# Patient Record
Sex: Male | Born: 1970 | Race: White | Hispanic: No | Marital: Married | Smoking: Never smoker
Health system: Southern US, Community
[De-identification: ages and names within clinical notes are randomized; demographics above are authoritative.]

## PROBLEM LIST (undated history)

## (undated) DIAGNOSIS — M1990 Unspecified osteoarthritis, unspecified site: Secondary | ICD-10-CM

## (undated) DIAGNOSIS — I10 Essential (primary) hypertension: Secondary | ICD-10-CM

## (undated) DIAGNOSIS — Z86018 Personal history of other benign neoplasm: Secondary | ICD-10-CM

## (undated) DIAGNOSIS — Z8719 Personal history of other diseases of the digestive system: Secondary | ICD-10-CM

## (undated) DIAGNOSIS — T7840XA Allergy, unspecified, initial encounter: Secondary | ICD-10-CM

## (undated) DIAGNOSIS — Z9889 Other specified postprocedural states: Secondary | ICD-10-CM

## (undated) DIAGNOSIS — E785 Hyperlipidemia, unspecified: Secondary | ICD-10-CM

## (undated) HISTORY — DX: Other specified postprocedural states: Z98.890

## (undated) HISTORY — DX: Personal history of other diseases of the digestive system: Z87.19

## (undated) HISTORY — PX: LIVER BIOPSY: SHX301

## (undated) HISTORY — PX: DERMOID CYST  EXCISION: SHX1452

## (undated) HISTORY — DX: Other specified postprocedural states: Z86.018

## (undated) HISTORY — DX: Allergy, unspecified, initial encounter: T78.40XA

## (undated) HISTORY — DX: Unspecified osteoarthritis, unspecified site: M19.90

## (undated) HISTORY — DX: Personal history of other benign neoplasm: Z86.018

## (undated) HISTORY — DX: Hyperlipidemia, unspecified: E78.5

---

## 1997-09-07 HISTORY — PX: RADICAL ORCHIECTOMY: SHX2285

## 2003-09-08 HISTORY — PX: SHOULDER ARTHROSCOPY: SHX128

## 2016-11-19 DIAGNOSIS — J1089 Influenza due to other identified influenza virus with other manifestations: Secondary | ICD-10-CM | POA: Diagnosis not present

## 2017-05-13 ENCOUNTER — Emergency Department: Payer: BLUE CROSS/BLUE SHIELD

## 2017-05-13 ENCOUNTER — Encounter: Payer: Self-pay | Admitting: Emergency Medicine

## 2017-05-13 ENCOUNTER — Emergency Department
Admission: EM | Admit: 2017-05-13 | Discharge: 2017-05-13 | Disposition: A | Discharge status: Home / Self Care | Payer: BLUE CROSS/BLUE SHIELD | Attending: Emergency Medicine | Admitting: Emergency Medicine

## 2017-05-13 DIAGNOSIS — R079 Chest pain, unspecified: Secondary | ICD-10-CM | POA: Diagnosis not present

## 2017-05-13 DIAGNOSIS — I10 Essential (primary) hypertension: Secondary | ICD-10-CM | POA: Diagnosis not present

## 2017-05-13 DIAGNOSIS — R0789 Other chest pain: Secondary | ICD-10-CM | POA: Diagnosis not present

## 2017-05-13 HISTORY — DX: Essential (primary) hypertension: I10

## 2017-05-13 LAB — BASIC METABOLIC PANEL
Anion gap: 8 (ref 5–15)
BUN: 15 mg/dL (ref 6–20)
CALCIUM: 9.6 mg/dL (ref 8.9–10.3)
CHLORIDE: 103 mmol/L (ref 101–111)
CO2: 27 mmol/L (ref 22–32)
CREATININE: 1.23 mg/dL (ref 0.61–1.24)
GFR calc Af Amer: >60 mL/min (ref >60)
GFR calc non Af Amer: >60 mL/min (ref >60)
GLUCOSE: 123 mg/dL — AB (ref 65–99)
Potassium: 3.9 mmol/L (ref 3.5–5.1)
Sodium: 138 mmol/L (ref 135–145)

## 2017-05-13 LAB — CBC
HCT: 45.5 % (ref 40.0–52.0)
Hemoglobin: 15.6 g/dL (ref 13.0–18.0)
MCH: 29.1 pg (ref 26.0–34.0)
MCHC: 34.2 g/dL (ref 32.0–36.0)
MCV: 85 fL (ref 80.0–100.0)
PLATELETS: 287 10*3/uL (ref 150–440)
RBC: 5.35 MIL/uL (ref 4.40–5.90)
RDW: 13.4 % (ref 11.5–14.5)
WBC: 10.1 10*3/uL (ref 3.8–10.6)

## 2017-05-13 LAB — TROPONIN I: Troponin I: <0.03 ng/mL (ref <0.03)

## 2017-05-13 MED ORDER — CYCLOBENZAPRINE HCL 10 MG PO TABS
10.0000 mg | ORAL_TABLET | Freq: Once | ORAL | Status: AC
  Administered 2017-05-13: 10 mg via ORAL

## 2017-05-13 MED ORDER — IBUPROFEN 600 MG PO TABS: 600.0000 mg | ORAL_TABLET | Freq: Four times a day (QID) | ORAL | 0 refills | Status: DC | PRN

## 2017-05-13 MED ORDER — CYCLOBENZAPRINE HCL 10 MG PO TABS
ORAL_TABLET | ORAL | Status: AC
  Filled 2017-05-13: qty 1

## 2017-05-13 MED ORDER — CYCLOBENZAPRINE HCL 5 MG PO TABS: 5.0000 mg | ORAL_TABLET | Freq: Three times a day (TID) | ORAL | 0 refills | Status: DC | PRN

## 2017-05-13 NOTE — ED Provider Notes (Signed)
[facility] Emergency Department Provider Note  Time seen: 9:13 PM  I have reviewed the triage vital signs and the nursing notes.   HISTORY  Chief Complaint Chest Pain    HPI Todd Black is a 46 y.o. male With a past medical history of hypertension who presents to the emergency department for left-sided chest discomfort. According to the patient since yesterday he has been experiencing left-sided chest pain. He states the pain is worse if he takes a deep breath or moves his left arm. Patient thought he pulled a muscle but did not recall doing anything overly strenuous. States continued discomfort today so he came to the emergency department for evaluation. Patient states he was evaluated last year for chest pain as well ultimately had a normal workup including a stress test per patient. States the pain is moderate, dull but becomes sharp with movement. Denies any cough. Denies any fever. No leg pain or swelling.  Past Medical History:  Diagnosis Date  . Hypertension     There are no active problems to display for this patient.   Past Surgical History:  Procedure Laterality Date  . LIVER BIOPSY      Prior to Admission medications   Not on File    Allergies  Allergen Reactions  . Bee Venom     No family history on file.  Social History Social History  Substance Use Topics  . Smoking status: Never Smoker  . Smokeless tobacco: Never Used  . Alcohol use No    Review of Systems Constitutional: Negative for fever. Cardiovascular: intermittent left chest pain Respiratory: Negative for shortness of breath. Gastrointestinal: Negative for abdominal pain Musculoskeletal: negative for leg pain or swelling. Neurological: Negative for headaches, focal weakness or numbness. All other ROS negative  ____________________________________________   PHYSICAL EXAM:  VITAL SIGNS: ED Triage Vitals  Enc Vitals Group     BP 05/13/17 2022 (!) 179/99      Pulse Rate 05/13/17 2022 75     Resp 05/13/17 2022 18     Temp 05/13/17 2022 98.7 F (37.1 C)     Temp src --      SpO2 05/13/17 2022 97 %     Weight 05/13/17 2023 198 lb (89.8 kg)     Height 05/13/17 2023 5\' 7"  (1.702 m)     Head Circumference --      Peak Flow --      Pain Score 05/13/17 2023 7     Pain Loc --      Pain Edu? --      Excl. in [place]? --     Constitutional: Alert and oriented. Well appearing and in no distress. Eyes: Normal exam ENT   Head: Normocephalic and atraumatic.   Mouth/Throat: Mucous membranes are moist. Cardiovascular: Normal rate, regular rhythm. No murmur Respiratory: Normal respiratory effort without tachypnea nor retractions. Breath sounds are clear. Moderate left chest tenderness palpation, worse with movement of the left shoulder. Good range of motion in left shoulder. Gastrointestinal: Soft and nontender. No distention. Musculoskeletal: Nontender with normal range of motion in all extremities. No lower extremity tenderness or edema. Neurologic:  Normal speech and language. No gross focal neurologic deficit Skin:  Skin is warm, dry and intact.  Psychiatric: Mood and affect are normal. Speech and behavior are normal.   ____________________________________________    EKG  EKG reviewed and interpreted by myself shows normal sinus rhythm at 74 bpm, narrow QRS, normal axis, normal intervals, no concerning ST changes.  ____________________________________________    RADIOLOGY  x-ray negative  ____________________________________________   INITIAL IMPRESSION / ASSESSMENT AND PLAN / ED COURSE  Pertinent labs & imaging results that were available during my care of the patient were reviewed by me and considered in my medical decision making (see chart for details).  patient presented to the emergency department for left-sided chest pain for the past 2 days, worse with movement of the left arm/shoulder. Overall the patient appears very  well, chest pain is reproducible on exam. Patient's labs are normal including negative troponin. EKG is reassuring. Highly suspect chest wall pain/musculoskeletal pain. Patient will follow-up with his doctor.I discussed my normal chest pain return precautions.   x-rays negative. Labs are normal. Highly suspect musculoskeletal pain. We will discharge with Fioricet and ibuprofen. I discussed my normal chest pain return precautions. ____________________________________________   FINAL CLINICAL IMPRESSION(S) / ED DIAGNOSES  chest pain    Harvest Dark, MD 05/13/17 2152

## 2017-05-13 NOTE — ED Triage Notes (Signed)
Patient with complaint of left side chest pain radiating to his back and left arm. Patient states that the chest pain started yesterday. Patient states that the pain is worse when he takes a deep breath. Patient denies shortness of breath or nausea.

## 2017-05-13 NOTE — ED Notes (Signed)
Pt has left side chest pain since yesterday.  Pt reports pain in left shoulder blade.  No sob.  No n/v/d   No diaphoresis.  Pt alert.  Speech clear.

## 2017-05-13 NOTE — Discharge Instructions (Signed)
You have been seen in the emergency department today for chest pain. Your workup has shown normal results. As we discussed please follow-up with your primary care physician in the next 1-2 days for recheck. Return to the emergency department for any further chest pain, trouble breathing, or any other symptom personally concerning to yourself. °

## 2017-09-25 DIAGNOSIS — I10 Essential (primary) hypertension: Secondary | ICD-10-CM | POA: Diagnosis not present

## 2018-08-10 ENCOUNTER — Telehealth: Payer: Self-pay

## 2018-08-10 NOTE — Telephone Encounter (Signed)
Called pt to r/s 12/5 appt. No answer and the mailbox is full.

## 2018-08-10 NOTE — Telephone Encounter (Signed)
Disregard previous message. Pt doesn't need to r/s 12/5 appt.

## 2018-08-11 ENCOUNTER — Ambulatory Visit (INDEPENDENT_AMBULATORY_CARE_PROVIDER_SITE_OTHER): Payer: BLUE CROSS/BLUE SHIELD | Admitting: Family Medicine

## 2018-08-11 ENCOUNTER — Encounter: Payer: Self-pay | Admitting: Family Medicine

## 2018-08-11 VITALS — BP 182/110 | HR 58 | Temp 98.3°F | Ht 67.0 in | Wt 192.2 lb

## 2018-08-11 DIAGNOSIS — Z23 Encounter for immunization: Secondary | ICD-10-CM

## 2018-08-11 DIAGNOSIS — K760 Fatty (change of) liver, not elsewhere classified: Secondary | ICD-10-CM | POA: Insufficient documentation

## 2018-08-11 DIAGNOSIS — E785 Hyperlipidemia, unspecified: Secondary | ICD-10-CM | POA: Diagnosis not present

## 2018-08-11 DIAGNOSIS — I10 Essential (primary) hypertension: Secondary | ICD-10-CM | POA: Diagnosis not present

## 2018-08-11 MED ORDER — HYDROCHLOROTHIAZIDE 25 MG PO TABS: 25.0000 mg | ORAL_TABLET | Freq: Every day | ORAL | 2 refills | Status: DC

## 2018-08-11 NOTE — Patient Instructions (Addendum)
Check your blood pressure a few times a week.   Make sure you are sitting down with feet flat on the ground and you have been sitting for at least 5 minutes.   Goal blood pressure ~130/80  If you develop chest pain, shortness of breath, headaches, vision changes -- these are signs that your blood pressure is too high and you should seek medical attention immediately  Return in 4 weeks for blood work  Let me know if you blood pressure is consistently 170-180/100  Consider trying taking your medication at night

## 2018-08-11 NOTE — Assessment & Plan Note (Signed)
Per report previously on statin but stopped. Will follow-up lipids and assess risk.

## 2018-08-11 NOTE — Assessment & Plan Note (Signed)
BP elevated today w/o symptoms of HTN emergency. Will try to lower slowly with additional antihypertensive agent. Long discussion regarding possibility of stopping Bystolic and trying a Ca channel blocker if still having difficulty with BP control HR 58 today but given hx of poor tolerance of 3rd agent suspect this may be in part related to Beta blocker. Home monitoring and return sooner if BP remaining 180/100.

## 2018-08-11 NOTE — Addendum Note (Signed)
Addended by: Kris Mouton on: 08/11/2018 09:32 AM   Modules accepted: Orders

## 2018-08-11 NOTE — Progress Notes (Signed)
Subjective:     Todd Black is a 47 y.o. male presenting for Establish Care (previous PCP was with [facility] in [place].)     HPI  #HTN - checks at work 1 time per week - 130/80 - was high a few years ago 200/110 - high stress at work at that time  - has lost 15 lbs in the last year - through exercise - no cp, sob, HA - was on medication 10 years ago, though no longer needed - no medication for 4-5 years - has tried lisinopril and nebivolol and was eventually on a 3rd medication - but could not tolerate a 3rd drug - takes his medication daily - though recent GI upset and missed doses on monday  Review of Systems  Eyes: Negative for visual disturbance.  Respiratory: Negative for chest tightness and shortness of breath.   Cardiovascular: Negative for chest pain.  Neurological: Negative for headaches.     Social History   Tobacco Use  Smoking Status Never Smoker  Smokeless Tobacco Never Used        Objective:    BP Readings from Last 3 Encounters:  08/11/18 (!) 182/110  05/13/17 (!) 158/90   Wt Readings from Last 3 Encounters:  08/11/18 192 lb 4 oz (87.2 kg)  05/13/17 198 lb (89.8 kg)    BP (!) 182/110   Pulse (!) 58   Temp 98.3 F (36.8 C)   Ht 5\' 7"  (1.702 m)   Wt 192 lb 4 oz (87.2 kg)   SpO2 99%   BMI 30.11 kg/m    Physical Exam  Constitutional: He appears well-developed and well-nourished. No distress.  HENT:  Head: Normocephalic and atraumatic.  Right Ear: External ear normal.  Left Ear: External ear normal.  Eyes: Conjunctivae and EOM are normal. No scleral icterus.  Neck: Neck supple. Carotid bruit is not present.  Cardiovascular: Normal rate, regular rhythm and normal heart sounds.  No murmur heard. Pulmonary/Chest: Effort normal and breath sounds normal. No respiratory distress. He has no wheezes.  Abdominal: Soft. Bowel sounds are normal. He exhibits no distension. There is no tenderness. There is no guarding.  Upper  abdominal hernia  Musculoskeletal: Normal range of motion.  Neurological: He is alert.  Skin: Skin is warm and dry. Capillary refill takes less than 2 seconds. He is not diaphoretic.  Psychiatric: He has a normal mood and affect.          Assessment & Plan:   Problem List Items Addressed This Visit      Cardiovascular and Mediastinum   HTN (hypertension) - Primary    BP elevated today w/o symptoms of HTN emergency. Will try to lower slowly with additional antihypertensive agent. Long discussion regarding possibility of stopping Bystolic and trying a Ca channel blocker if still having difficulty with BP control HR 58 today but given hx of poor tolerance of 3rd agent suspect this may be in part related to Beta blocker. Home monitoring and return sooner if BP remaining 180/100.       Relevant Medications   lisinopril (PRINIVIL,ZESTRIL) 40 MG tablet   nebivolol (BYSTOLIC) 10 MG tablet   hydrochlorothiazide (HYDRODIURIL) 25 MG tablet     Other   Hyperlipidemia    Per report previously on statin but stopped. Will follow-up lipids and assess risk.       Relevant Medications   lisinopril (PRINIVIL,ZESTRIL) 40 MG tablet   nebivolol (BYSTOLIC) 10 MG tablet   hydrochlorothiazide (HYDRODIURIL) 25 MG  tablet        Return in about 4 weeks (around 09/08/2018).  Lesleigh Noe, MD

## 2018-08-15 ENCOUNTER — Encounter: Payer: Self-pay | Admitting: Family Medicine

## 2018-08-15 DIAGNOSIS — I517 Cardiomegaly: Secondary | ICD-10-CM | POA: Insufficient documentation

## 2018-08-16 ENCOUNTER — Ambulatory Visit (INDEPENDENT_AMBULATORY_CARE_PROVIDER_SITE_OTHER): Payer: BLUE CROSS/BLUE SHIELD | Admitting: Family Medicine

## 2018-08-16 ENCOUNTER — Telehealth: Payer: Self-pay | Admitting: Family Medicine

## 2018-08-16 ENCOUNTER — Encounter: Payer: Self-pay | Admitting: Family Medicine

## 2018-08-16 ENCOUNTER — Telehealth: Payer: Self-pay

## 2018-08-16 VITALS — BP 98/76 | HR 80 | Temp 98.2°F | Ht 67.0 in | Wt 188.5 lb

## 2018-08-16 DIAGNOSIS — I10 Essential (primary) hypertension: Secondary | ICD-10-CM | POA: Diagnosis not present

## 2018-08-16 DIAGNOSIS — K760 Fatty (change of) liver, not elsewhere classified: Secondary | ICD-10-CM | POA: Diagnosis not present

## 2018-08-16 DIAGNOSIS — R42 Dizziness and giddiness: Secondary | ICD-10-CM | POA: Diagnosis not present

## 2018-08-16 LAB — COMPREHENSIVE METABOLIC PANEL
ALT: 33 U/L (ref 0–53)
AST: 23 U/L (ref 0–37)
Albumin: 4.9 g/dL (ref 3.5–5.2)
Alkaline Phosphatase: 97 U/L (ref 39–117)
BUN: 27 mg/dL — AB (ref 6–23)
CHLORIDE: 99 meq/L (ref 96–112)
CO2: 30 meq/L (ref 19–32)
CREATININE: 2.3 mg/dL — AB (ref 0.40–1.50)
Calcium: 10.6 mg/dL — ABNORMAL HIGH (ref 8.4–10.5)
GFR: 32.29 mL/min — ABNORMAL LOW (ref >60.00)
Glucose, Bld: 117 mg/dL — ABNORMAL HIGH (ref 70–99)
Potassium: 4 mEq/L (ref 3.5–5.1)
SODIUM: 137 meq/L (ref 135–145)
Total Bilirubin: 0.4 mg/dL (ref 0.2–1.2)
Total Protein: 8.2 g/dL (ref 6.0–8.3)

## 2018-08-16 NOTE — Telephone Encounter (Signed)
Attempted to call patient to discuss lab results. Got voicemail and the box was full. Unable to leave message  Labs show worsening kidney function. Unclear what caused this.   Recommend the following:  1) Return on Thursday for blood pressure check 2) Will repeat labs on Thursday 3) Do not take any medications until we discuss at the appointment on Thursday 4) Try to drink 2 liters of water daily for the next 2 days 5) Do not take any ibuprofen or other NSAIDs (naproxen)  OK for Friday appointment with other provider if patient cannot come to see me Thursday  Go to the ER if -- dizziness persist, you experience chest pain, shortness of breath, faint, or severe fatigue.   Routing to MA to try to reach patient

## 2018-08-16 NOTE — Patient Instructions (Addendum)
Check your blood pressure every day.   When you are no longer feeling dizzy and your blood pressure is >140/90 then you can restart medication.   Follow this Plan for restarting medication:   1. 1/2 of Lisinopril (20 mg) x 2 days 2. Full Lisinopril (40 mg) x 2 days  If blood pressure still elevated >140/90  3. 1/2 of Hydrochlorothiazide 12.5 mg x 2 days 4. Full Hydrochlorothiazide 25 mg x 2 days  Continue to check blood pressure every other day. Call the clinic if your blood pressure remains >140/90.   I'd like to avoid restarting the Bystolic.

## 2018-08-16 NOTE — Progress Notes (Signed)
Subjective:     Todd Black is a 47 y.o. male presenting for Dizziness, lethargic (started this morning low blood pressure)     HPI  #Dizziness - BP was 132/82 on Sunday - started feeling nauseous around 2:30 am, at 5:30 - dizzy, nausea, cold sweats, heavy eyes - felt similar after getting out the shower - all low blood pressure - today after a shower it was 84/58 and then 90/60 - endorses dizziness, lethargy - did not take morning BP medication - Medications: Nebivolol 10 mg , Lisinopril 40, HCTZ 25 - prior to today was having occasionally dizziness - went to work and continued to check blood pressure and continued to be 90/60s and continued to not feel well - lost a few pounds of water weight - drinks 40-64 oz of water    Review of Systems  Constitutional: Positive for diaphoresis. Negative for chills and fever.  HENT: Negative for rhinorrhea, sinus pressure and sore throat.   Respiratory: Negative for cough, chest tightness and shortness of breath.   Cardiovascular: Negative for chest pain, palpitations and leg swelling.  Gastrointestinal: Positive for abdominal pain and nausea. Negative for vomiting.  Musculoskeletal: Negative for arthralgias and myalgias.  Neurological: Positive for dizziness.     Social History   Tobacco Use  Smoking Status Never Smoker  Smokeless Tobacco Never Used        Objective:    BP Readings from Last 3 Encounters:  08/16/18 98/76  08/11/18 (!) 182/110  05/13/17 (!) 158/90   Wt Readings from Last 3 Encounters:  08/16/18 188 lb 8 oz (85.5 kg)  08/11/18 192 lb 4 oz (87.2 kg)  05/13/17 198 lb (89.8 kg)    BP 98/76   Pulse 80   Temp 98.2 F (36.8 C) (Oral)   Ht 5\' 7"  (1.702 m)   Wt 188 lb 8 oz (85.5 kg)   SpO2 99%   BMI 29.52 kg/m    Physical Exam  Constitutional: He appears well-developed and well-nourished. No distress.  HENT:  Right Ear: External ear normal.  Left Ear: External ear normal.  Eyes:  Conjunctivae and EOM are normal. No scleral icterus.  Neck: Neck supple.  Cardiovascular: Normal rate, regular rhythm and normal heart sounds.  No murmur heard. Pulmonary/Chest: Effort normal and breath sounds normal. No respiratory distress. He has no wheezes. He has no rales.  Musculoskeletal: Normal range of motion.  Neurological: He is alert.  Skin: Skin is warm and dry. He is not diaphoretic.  Psychiatric: He has a normal mood and affect.     EKG: NSR, no ST changes. Unchanged compared to prior.     Assessment & Plan:   Problem List Items Addressed This Visit      Cardiovascular and Mediastinum   HTN (hypertension) - Primary    Low BP in the setting of starting a 3rd agent. EKG w/o signs of acute event today. Hold medications until BP normalizes and restart per patient instructions. BMP today to assess for electrolyte or kidney abnormality      Relevant Orders   EKG 12-Lead (Completed)   Comprehensive metabolic panel     Digestive   NAFLD (nonalcoholic fatty liver disease)    Will repeat LFTs today as checking BMP      Relevant Orders   Comprehensive metabolic panel     Other   Dizziness    Etiology likely 2/2 to over medication for BP. No signs of illness or fever. And EKG reassuring. Labs today.  Relevant Orders   EKG 12-Lead (Completed)     Patient Instructions  Check your blood pressure every day.   When you are no longer feeling dizzy and your blood pressure is >140/90 then you can restart medication.   Follow this Plan for restarting medication:   1. 1/2 of Lisinopril (20 mg) x 2 days 2. Full Lisinopril (40 mg) x 2 days  If blood pressure still elevated >140/90  3. 1/2 of Hydrochlorothiazide 12.5 mg x 2 days 4. Full Hydrochlorothiazide 25 mg x 2 days  Continue to check blood pressure every other day. Call the clinic if your blood pressure remains >140/90.   I'd like to avoid restarting the Bystolic.     Return in about 3 weeks (around  09/06/2018), or if symptoms worsen or fail to improve, for keep scheduled appointment.  Lesleigh Noe, MD

## 2018-08-16 NOTE — Assessment & Plan Note (Signed)
Will repeat LFTs today as checking BMP

## 2018-08-16 NOTE — Telephone Encounter (Signed)
Called patient and was not able to reach him. Spoke with patient's wife and relayed all the information below and appointment is made for Thursday for 8:40 am and patient will come a little earlier and get lab work done before seen Dr Einar Pheasant. Wife understood everything.

## 2018-08-16 NOTE — Telephone Encounter (Signed)
pts wife (DPR signed) said on 08/15/18 BP 130's / ? and this AM after shower BP 85/50 something; recked BP 90/60. Pt was dizzy, lethargic, has not taken any BP med today but pt did go to work. pts wife wants pt seen and she will go and pick pt up. Pt is scheduled with Dr Einar Pheasant 08/16/18 at 8:40. FYI to Dr Einar Pheasant.

## 2018-08-16 NOTE — Assessment & Plan Note (Signed)
Low BP in the setting of starting a 3rd agent. EKG w/o signs of acute event today. Hold medications until BP normalizes and restart per patient instructions. BMP today to assess for electrolyte or kidney abnormality

## 2018-08-16 NOTE — Assessment & Plan Note (Signed)
Etiology likely 2/2 to over medication for BP. No signs of illness or fever. And EKG reassuring. Labs today.

## 2018-08-16 NOTE — Telephone Encounter (Signed)
Noted will see at 8:40

## 2018-08-18 ENCOUNTER — Encounter: Payer: Self-pay | Admitting: Family Medicine

## 2018-08-18 ENCOUNTER — Ambulatory Visit (INDEPENDENT_AMBULATORY_CARE_PROVIDER_SITE_OTHER): Payer: BLUE CROSS/BLUE SHIELD | Admitting: Family Medicine

## 2018-08-18 VITALS — BP 138/96 | HR 78 | Temp 98.1°F | Resp 12 | Ht 67.0 in | Wt 190.2 lb

## 2018-08-18 DIAGNOSIS — I10 Essential (primary) hypertension: Secondary | ICD-10-CM | POA: Diagnosis not present

## 2018-08-18 DIAGNOSIS — R42 Dizziness and giddiness: Secondary | ICD-10-CM

## 2018-08-18 DIAGNOSIS — N179 Acute kidney failure, unspecified: Secondary | ICD-10-CM | POA: Diagnosis not present

## 2018-08-18 LAB — CBC
HEMATOCRIT: 49.1 % (ref 39.0–52.0)
HEMOGLOBIN: 16.6 g/dL (ref 13.0–17.0)
MCHC: 33.8 g/dL (ref 30.0–36.0)
MCV: 87.1 fl (ref 78.0–100.0)
PLATELETS: 316 10*3/uL (ref 150.0–400.0)
RBC: 5.64 Mil/uL (ref 4.22–5.81)
RDW: 13.5 % (ref 11.5–15.5)
WBC: 8 10*3/uL (ref 4.0–10.5)

## 2018-08-18 LAB — BASIC METABOLIC PANEL
BUN: 22 mg/dL (ref 6–23)
CHLORIDE: 101 meq/L (ref 96–112)
CO2: 33 meq/L — AB (ref 19–32)
Calcium: 10.7 mg/dL — ABNORMAL HIGH (ref 8.4–10.5)
Creatinine, Ser: 1.29 mg/dL (ref 0.40–1.50)
GFR: 62.93 mL/min (ref >60.00)
GLUCOSE: 100 mg/dL — AB (ref 70–99)
Potassium: 4.3 mEq/L (ref 3.5–5.1)
SODIUM: 140 meq/L (ref 135–145)

## 2018-08-18 NOTE — Assessment & Plan Note (Signed)
Resolved with return to normal BP.

## 2018-08-18 NOTE — Assessment & Plan Note (Signed)
BP elevated today. Will determine which medication to add based on results of BMP.

## 2018-08-18 NOTE — Assessment & Plan Note (Signed)
Suspect etiology was dehydration and underperfusion in the setting of low BP on 3 medications. Medications held and BP now elevated. Nephrology referral given AKI and difficulty managing BP - this is the second time his BP has significantly dropped with starting a 3rd agent. If AKI not resolved plan for ultrasound and phone consult to the nephrology.

## 2018-08-18 NOTE — Patient Instructions (Addendum)
  If Kidney function still elevated:  Bystolic - restart at 5 mg daily for 2 weeks, then increase to 10 mg daily

## 2018-08-18 NOTE — Progress Notes (Signed)
Subjective:     Todd Black is a 47 y.o. male presenting for Dizziness (2 day follow up. Resolved at this time. Need to discuss lab work.)     HPI  #Dizziness - resolved - BP is going back up - he has not restarted medication  Hydration - drank 5-6 x 24oz bottle of water yesterday - did drink some milk today  AKI - not taking ibuprofen regularly - no recent difficulty urinating - though did notice he did not urinate as much as he would expect with increased hydration  Review of Systems  Respiratory: Negative for cough and shortness of breath.   Cardiovascular: Negative for chest pain, palpitations and leg swelling.  Gastrointestinal: Negative for constipation, diarrhea, nausea and vomiting.  Genitourinary: Negative for difficulty urinating.     Social History   Tobacco Use  Smoking Status Never Smoker  Smokeless Tobacco Never Used        Objective:    BP Readings from Last 3 Encounters:  08/18/18 (!) 138/96  08/16/18 98/76  08/11/18 (!) 182/110   Wt Readings from Last 3 Encounters:  08/18/18 190 lb 4 oz (86.3 kg)  08/16/18 188 lb 8 oz (85.5 kg)  08/11/18 192 lb 4 oz (87.2 kg)    BP (!) 138/96   Pulse 78   Temp 98.1 F (36.7 C)   Resp 12   Ht 5\' 7"  (1.702 m)   Wt 190 lb 4 oz (86.3 kg)   SpO2 99%   BMI 29.80 kg/m    Physical Exam Constitutional:      Appearance: Normal appearance. He is not ill-appearing or diaphoretic.  HENT:     Head: Normocephalic and atraumatic.     Right Ear: External ear normal.     Left Ear: External ear normal.     Nose: Nose normal.  Eyes:     General: No scleral icterus.    Extraocular Movements: Extraocular movements intact.     Conjunctiva/sclera: Conjunctivae normal.  Neck:     Musculoskeletal: Neck supple.  Cardiovascular:     Rate and Rhythm: Normal rate and regular rhythm.     Heart sounds: No murmur.  Pulmonary:     Effort: Pulmonary effort is normal.     Breath sounds: Normal breath sounds.  No wheezing or rhonchi.  Abdominal:     General: Abdomen is flat. Bowel sounds are normal. There is no abdominal bruit.     Tenderness: There is abdominal tenderness in the right lower quadrant and left lower quadrant.  Skin:    General: Skin is warm.     Capillary Refill: Capillary refill takes less than 2 seconds.  Neurological:     Mental Status: He is alert and oriented to person, place, and time. Mental status is at baseline.  Psychiatric:        Mood and Affect: Mood normal.           Assessment & Plan:   Problem List Items Addressed This Visit      Cardiovascular and Mediastinum   HTN (hypertension)    BP elevated today. Will determine which medication to add based on results of BMP.        Relevant Orders   Ambulatory referral to Nephrology     Genitourinary   AKI (acute kidney injury) ([place]) - Primary    Suspect etiology was dehydration and underperfusion in the setting of low BP on 3 medications. Medications held and BP now elevated. Nephrology referral given AKI  and difficulty managing BP - this is the second time his BP has significantly dropped with starting a 3rd agent. If AKI not resolved plan for ultrasound and phone consult to the nephrology.       Relevant Orders   Basic metabolic panel   CBC   Ambulatory referral to Nephrology     Other   RESOLVED: Dizziness    Resolved with return to normal BP.           Return for dec 31.  Lesleigh Noe, MD

## 2018-08-23 ENCOUNTER — Telehealth: Payer: Self-pay | Admitting: *Deleted

## 2018-08-23 DIAGNOSIS — I10 Essential (primary) hypertension: Secondary | ICD-10-CM

## 2018-08-23 NOTE — Telephone Encounter (Signed)
Reviewed agree with increase.

## 2018-08-23 NOTE — Telephone Encounter (Signed)
Spoke to pts wife Santiago Glad who states pt BP was 161/110 this am. Pt proceeded to increase lisinopril to 1tab as directed by Dr Einar Pheasant in the [place] message. States they will keep log of pts BP readings and contact office back, if BP does not lower after increase in meds.

## 2018-08-24 MED ORDER — AMLODIPINE BESYLATE 5 MG PO TABS: ORAL_TABLET | ORAL | 1 refills | Status: DC

## 2018-08-24 NOTE — Addendum Note (Signed)
Addended by: Waunita Schooner R on: 08/24/2018 01:03 PM   Modules accepted: Orders

## 2018-08-24 NOTE — Telephone Encounter (Signed)
Called Kel  BP continues to be 160/110  Taking lisinopril 40 mg  Endorses a mild Headache. Does not typically have Headaches - did have a mild one with high blood pressure.  No vision changes, cp, or SOB  1. Essential hypertension Given persistent elevation will start new medication. Was on Beta blocker before but no clear indication for this. Given prior quick drops will start at 1/2 dose and slowly titrate.  - Warning signs - persistent HA, CP, vision changes, SOB >>> go to the ER or call the clinic for support.  - amLODipine (NORVASC) 5 MG tablet; Take 1/2 tablet by mouth daily x 7 days then increase to 5 mg if blood pressure still high.  Dispense: 30 tablet; Refill: 1

## 2018-08-24 NOTE — Telephone Encounter (Signed)
Spoke to pts wife Santiago Glad, who states pt took a full tab of lisinopril both yesterday and today and his BP is 165/115 this am. She is unaware if pt is experiencing any additional symptoms, but is wanting to know how to proceed. Pt has f/u appt 12/31

## 2018-09-06 ENCOUNTER — Ambulatory Visit (INDEPENDENT_AMBULATORY_CARE_PROVIDER_SITE_OTHER): Payer: BLUE CROSS/BLUE SHIELD | Admitting: Family Medicine

## 2018-09-06 ENCOUNTER — Encounter: Payer: Self-pay | Admitting: Family Medicine

## 2018-09-06 VITALS — BP 132/92 | HR 75 | Temp 98.3°F | Ht 67.0 in | Wt 192.2 lb

## 2018-09-06 DIAGNOSIS — K648 Other hemorrhoids: Secondary | ICD-10-CM | POA: Diagnosis not present

## 2018-09-06 DIAGNOSIS — I10 Essential (primary) hypertension: Secondary | ICD-10-CM | POA: Diagnosis not present

## 2018-09-06 DIAGNOSIS — Z23 Encounter for immunization: Secondary | ICD-10-CM | POA: Diagnosis not present

## 2018-09-06 LAB — BASIC METABOLIC PANEL
BUN: 16 mg/dL (ref 6–23)
CALCIUM: 10.3 mg/dL (ref 8.4–10.5)
CO2: 30 mEq/L (ref 19–32)
CREATININE: 1.19 mg/dL (ref 0.40–1.50)
Chloride: 102 mEq/L (ref 96–112)
GFR: 69.05 mL/min (ref >60.00)
GLUCOSE: 92 mg/dL (ref 70–99)
POTASSIUM: 4.7 meq/L (ref 3.5–5.1)
Sodium: 137 mEq/L (ref 135–145)

## 2018-09-06 MED ORDER — LISINOPRIL 40 MG PO TABS: 40.0000 mg | ORAL_TABLET | Freq: Every day | ORAL | 3 refills | Status: DC

## 2018-09-06 NOTE — Assessment & Plan Note (Signed)
Bleeding intermittently. Trial of OTC steroid cream. If persisting let me know given [facility] of crohn's disease

## 2018-09-06 NOTE — Progress Notes (Signed)
Subjective:     Todd Black is a 47 y.o. male presenting for Hypertension (4 week follow up. B/P still elevated.) and Blood In Stools (x 2. On 09/01/18. Blood in the toilet water, on toilet paper. Bowel was normal. Non since then.)     HPI  #HTN - BP still elevated at home - Increased to 5 mg of amlodipine and taking lisinopril 40 mg - range 147-177/96-115 - still feeling pretty tired - nephrology on 09/29/2017  #Blood in stool - 2-3 times a year for the last few years - no painful bowel movements - bleeding 1-2 days with BM - never takes any medication for this - toilet full of blood - no stool - BRBPR - happened 3 times on Thursday - sister with crohn's disease - [employer] so has gotten worked up for [employer] war   Review of Systems  Constitutional: Positive for fatigue.  Eyes: Negative for visual disturbance.  Respiratory: Negative for shortness of breath.   Cardiovascular: Negative for chest pain, palpitations and leg swelling.  Gastrointestinal: Positive for blood in stool. Negative for constipation and diarrhea.  Neurological: Negative for dizziness, light-headedness and headaches.    08/23/2018: Telephone call - BP still high 160/110 - start amlodipine 2.5 mg x 7 days then increase to 5 mg 08/18/2018: Clinic - AKI resolved, HTN - Start lisinopril 40 mg   Social History   Tobacco Use  Smoking Status Never Smoker  Smokeless Tobacco Never Used        Objective:    BP Readings from Last 3 Encounters:  09/06/18 (!) 132/92  08/18/18 (!) 138/96  08/16/18 98/76   Wt Readings from Last 3 Encounters:  09/06/18 192 lb 4 oz (87.2 kg)  08/18/18 190 lb 4 oz (86.3 kg)  08/16/18 188 lb 8 oz (85.5 kg)    BP (!) 132/92   Pulse 75   Temp 98.3 F (36.8 C)   Ht 5\' 7"  (1.702 m)   Wt 192 lb 4 oz (87.2 kg)   SpO2 100%   BMI 30.11 kg/m    Physical Exam Constitutional:      Appearance: Normal appearance. He is not ill-appearing or diaphoretic.  HENT:      Right Ear: External ear normal.     Left Ear: External ear normal.     Nose: Nose normal.  Eyes:     General: No scleral icterus.    Extraocular Movements: Extraocular movements intact.     Conjunctiva/sclera: Conjunctivae normal.  Neck:     Musculoskeletal: Neck supple.  Cardiovascular:     Rate and Rhythm: Normal rate and regular rhythm.     Heart sounds: No murmur.  Pulmonary:     Effort: Pulmonary effort is normal.     Breath sounds: Normal breath sounds. No wheezing or rales.  Genitourinary:    Rectum: Internal hemorrhoid present. No anal fissure or external hemorrhoid.  Skin:    General: Skin is warm and dry.  Neurological:     Mental Status: He is alert. Mental status is at baseline.  Psychiatric:        Mood and Affect: Mood normal.           Assessment & Plan:   Problem List Items Addressed This Visit      Cardiovascular and Mediastinum   HTN (hypertension) - Primary    BP improved but still high. Will repeat BMP to check kidney function now that he has restarted lisinopril. Increase amlodipine slowly 5>7.5>10  if BP still high. Nephrology next month and return in 6 weeks. Bring cuff to MA visit to check accuracy      Relevant Medications   lisinopril (PRINIVIL,ZESTRIL) 40 MG tablet   Other Relevant Orders   Basic metabolic panel   Internal hemorrhoid    Bleeding intermittently. Trial of OTC steroid cream. If persisting let me know given [facility] of crohn's disease      Relevant Medications   lisinopril (PRINIVIL,ZESTRIL) 40 MG tablet    Other Visit Diagnoses    Need for Tdap vaccination       Relevant Orders   Tdap vaccine greater than or equal to 7yo IM       Return in about 6 weeks (around 10/18/2018).  Lesleigh Noe, MD

## 2018-09-06 NOTE — Assessment & Plan Note (Signed)
BP improved but still high. Will repeat BMP to check kidney function now that he has restarted lisinopril. Increase amlodipine slowly 5>7.5>10 if BP still high. Nephrology next month and return in 6 weeks. Bring cuff to MA visit to check accuracy

## 2018-09-06 NOTE — Patient Instructions (Addendum)
#Blood pressure - Bring you cuff to the office to check blood pressure accuracy - you can see my MA to check this - we will get labs today to check kidney function  #Blood in stool - This is an internal hemorrhoid - if it is continuing or not improving -- let me know and I will refer you to GI to discuss Hemorrhoids Hemorrhoids are swollen veins that may develop:  In the butt (rectum). These are called internal hemorrhoids.  Around the opening of the butt (anus). These are called external hemorrhoids. Hemorrhoids can cause pain, itching, or bleeding. Most of the time, they do not cause serious problems. They usually get better with diet changes, lifestyle changes, and other home treatments. What are the causes? This condition may be caused by:  Having trouble pooping (constipation).  Pushing hard (straining) to poop.  Watery poop (diarrhea).  Pregnancy.  Being very overweight (obese).  Sitting for long periods of time.  Heavy lifting or other activity that causes you to strain.  Anal sex.  Riding a bike for a long period of time. What are the signs or symptoms? Symptoms of this condition include:  Pain.  Itching or soreness in the butt.  Bleeding from the butt.  Leaking poop.  Swelling in the area.  One or more lumps around the opening of your butt. How is this diagnosed? A doctor can often diagnose this condition by looking at the affected area. The doctor may also:  Do an exam that involves feeling the area with a gloved hand (digital rectal exam).  Examine the area inside your butt using a small tube (anoscope).  Order blood tests. This may be done if you have lost a lot of blood.  Have you get a test that involves looking inside the colon using a flexible tube with a camera on the end (sigmoidoscopy or colonoscopy). How is this treated? This condition can usually be treated at home. Your doctor may tell you to change what you eat, make lifestyle  changes, or try home treatments. If these do not help, procedures can be done to remove the hemorrhoids or make them smaller. These may involve:  Placing rubber bands at the base of the hemorrhoids to cut off their blood supply.  Injecting medicine into the hemorrhoids to shrink them.  Shining a type of light energy onto the hemorrhoids to cause them to fall off.  Doing surgery to remove the hemorrhoids or cut off their blood supply. Follow these instructions at home: Eating and drinking   Eat foods that have a lot of fiber in them. These include whole grains, beans, nuts, fruits, and vegetables.  Ask your doctor about taking products that have added fiber (fibersupplements).  Reduce the amount of fat in your diet. You can do this by: ? Eating low-fat dairy products. ? Eating less red meat. ? Avoiding processed foods.  Drink enough fluid to keep your pee (urine) pale yellow. Managing pain and swelling   Take a warm-water bath (sitz bath) for 20 minutes to ease pain. Do this 3-4 times a day. You may do this in a bathtub or using a portable sitz bath that fits over the toilet.  If told, put ice on the painful area. It may be helpful to use ice between your warm baths. ? Put ice in a plastic bag. ? Place a towel between your skin and the bag. ? Leave the ice on for 20 minutes, 2-3 times a day. General instructions  Take over-the-counter and prescription medicines only as told by your doctor. ? Medicated creams and medicines may be used as told.  Exercise often. Ask your doctor how much and what kind of exercise is best for you.  Go to the bathroom when you have the urge to poop. Do not wait.  Avoid pushing too hard when you poop.  Keep your butt dry and clean. Use wet toilet paper or moist towelettes after pooping.  Do not sit on the toilet for a long time.  Keep all follow-up visits as told by your doctor. This is important. Contact a doctor if you:  Have pain and  swelling that do not get better with treatment or medicine.  Have trouble pooping.  Cannot poop.  Have pain or swelling outside the area of the hemorrhoids. Get help right away if you have:  Bleeding that will not stop. Summary  Hemorrhoids are swollen veins in the butt or around the opening of the butt.  They can cause pain, itching, or bleeding.  Eat foods that have a lot of fiber in them. These include whole grains, beans, nuts, fruits, and vegetables.  Take a warm-water bath (sitz bath) for 20 minutes to ease pain. Do this 3-4 times a day. This information is not intended to replace advice given to you by your health care provider. Make sure you discuss any questions you have with your health care provider. Document Released: 06/02/2008 Document Revised: 01/13/2018 Document Reviewed: 01/13/2018 Elsevier Interactive Patient Education  2019 [employer].

## 2018-09-29 DIAGNOSIS — I10 Essential (primary) hypertension: Secondary | ICD-10-CM | POA: Diagnosis not present

## 2018-09-29 DIAGNOSIS — N179 Acute kidney failure, unspecified: Secondary | ICD-10-CM | POA: Diagnosis not present

## 2018-09-30 ENCOUNTER — Other Ambulatory Visit: Payer: Self-pay | Admitting: Nephrology

## 2018-09-30 DIAGNOSIS — N179 Acute kidney failure, unspecified: Secondary | ICD-10-CM

## 2018-10-01 IMAGING — CR DG CHEST 2V
1 series · 2 of 2 positions shown · non-contrast
Comparison: None.

CLINICAL DATA: Chest pain for 2 days

EXAM:
CHEST  2 VIEW

[Series 1: dg chest 2 view · 0.14mm/px · 2 of 2 slices shown]
[im 1/2]
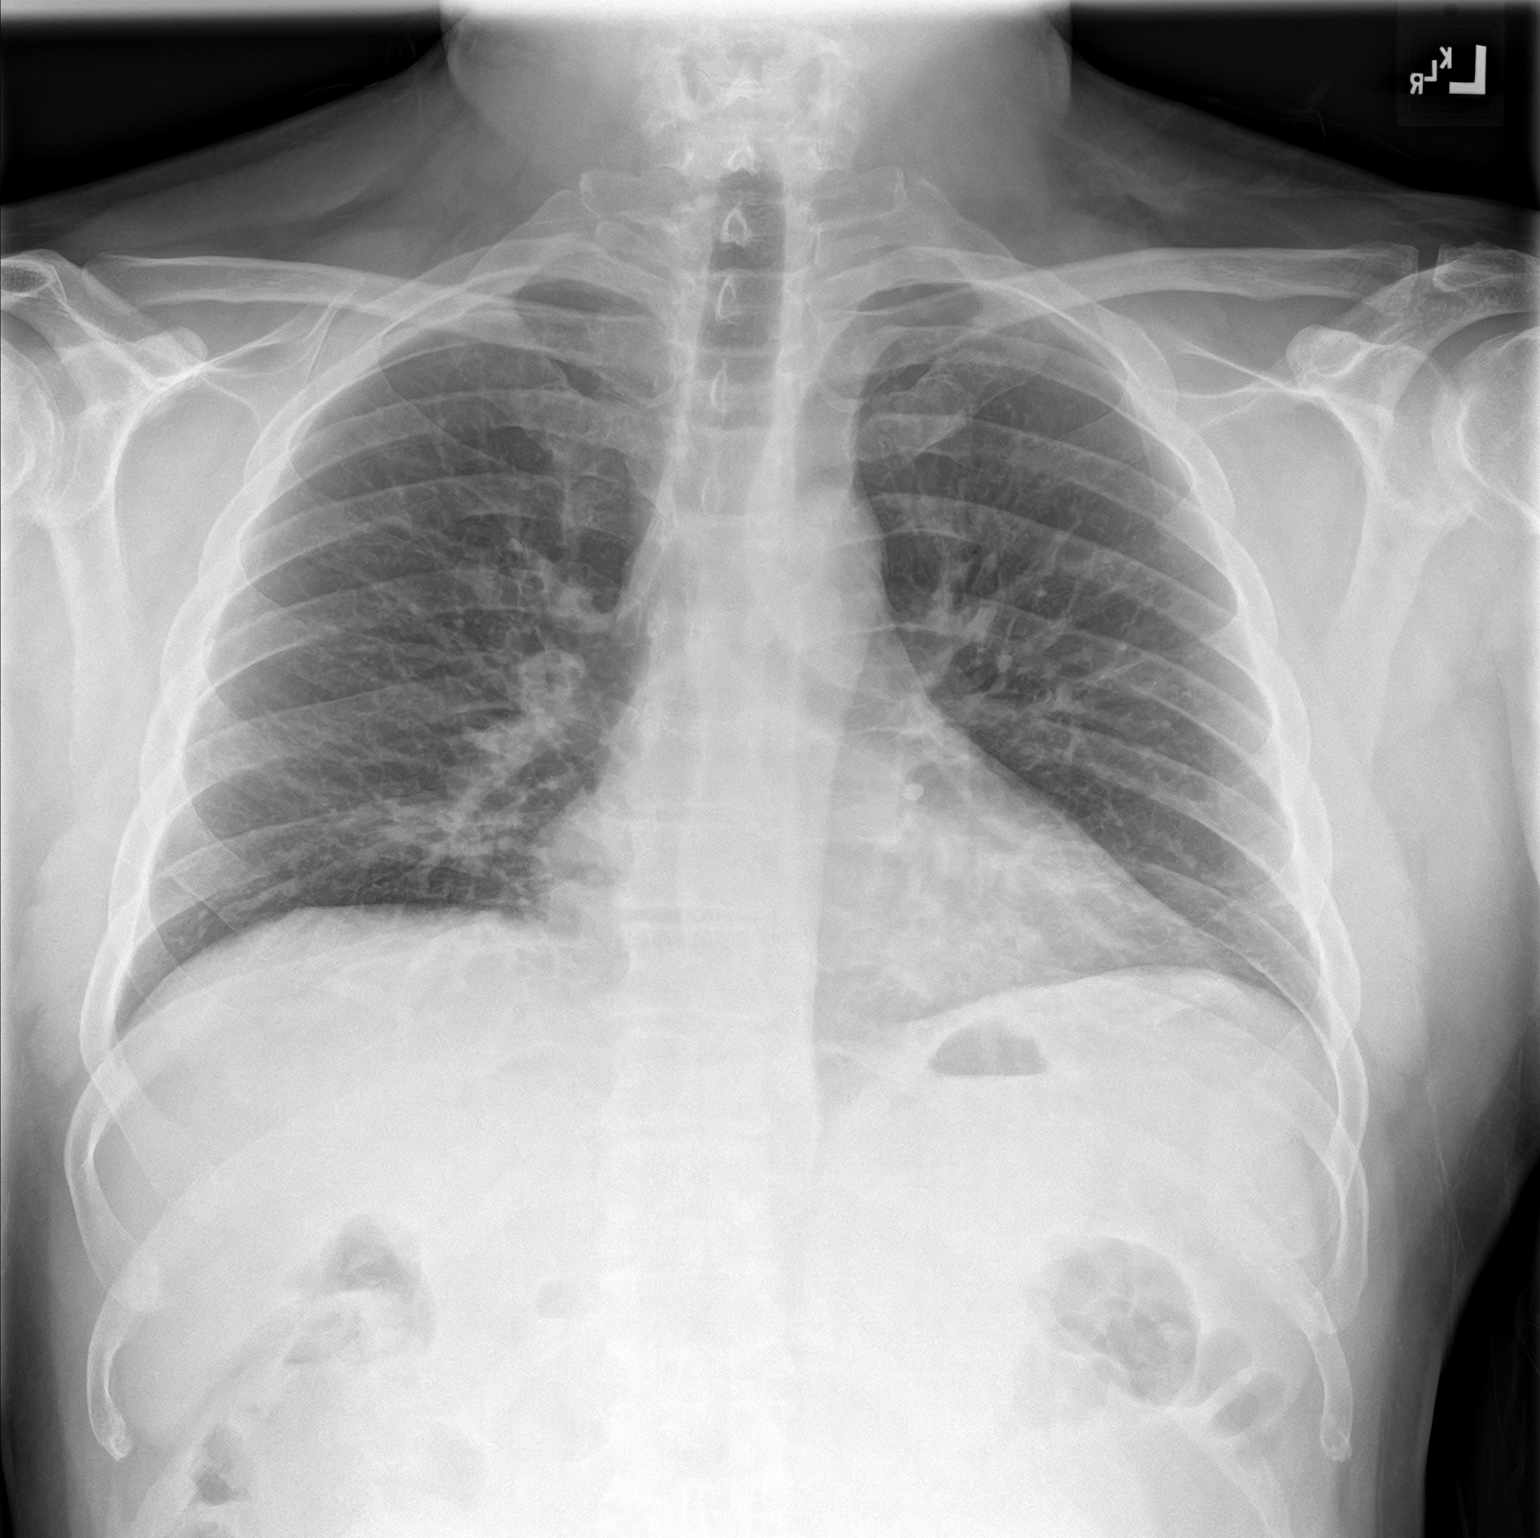
[im 2/2]
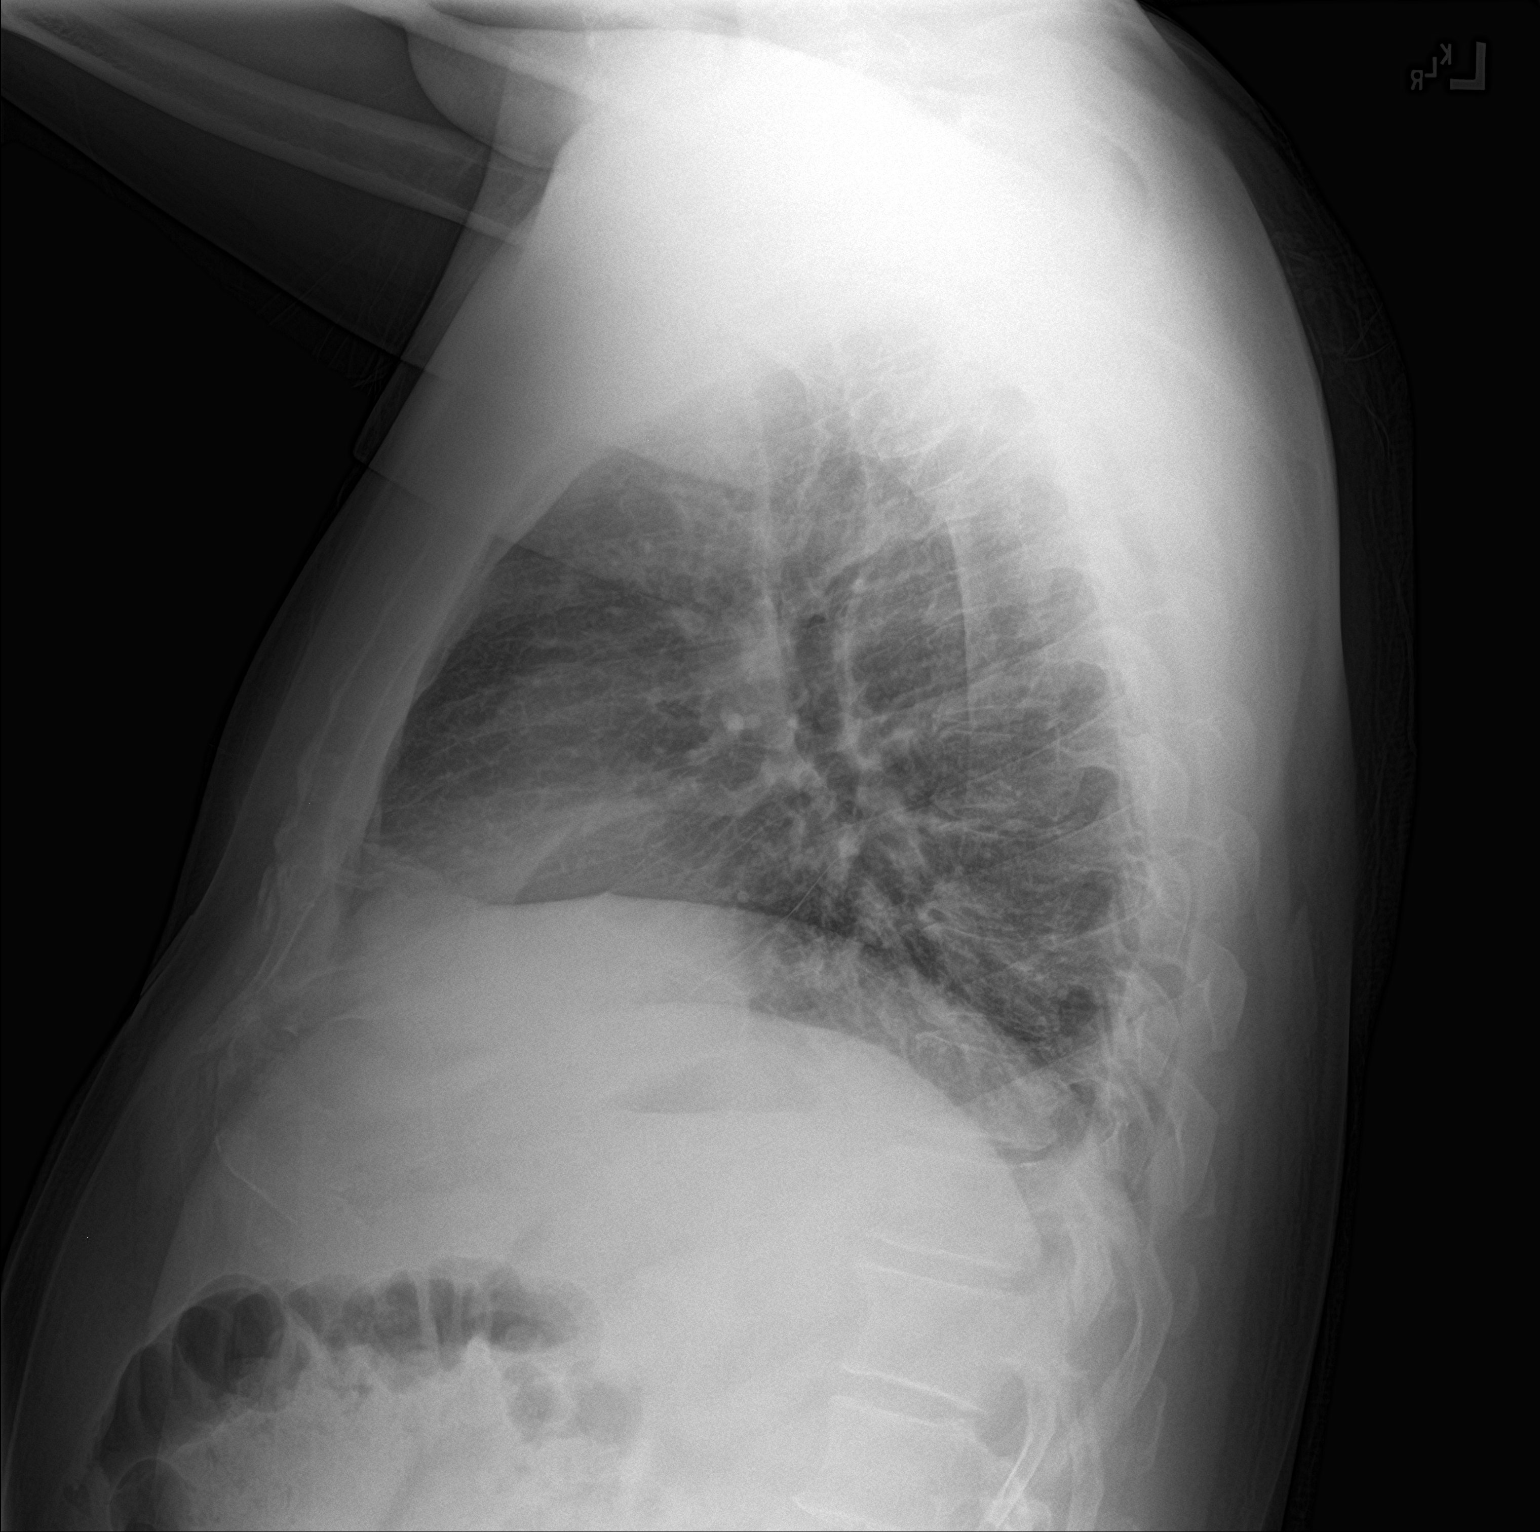

[2 of 2 positions shown; findings below may reference images not displayed]

FINDINGS: The heart size and mediastinal contours are within normal limits.
Both lungs are clear. The visualized skeletal structures are
unremarkable.
IMPRESSION: No active cardiopulmonary disease.

## 2018-10-05 ENCOUNTER — Telehealth: Payer: Self-pay

## 2018-10-05 DIAGNOSIS — I10 Essential (primary) hypertension: Secondary | ICD-10-CM

## 2018-10-05 NOTE — Telephone Encounter (Signed)
Patient stopped by today to check his b/p and compare his b/p monitor reading to ours. B/P was checked in the left arm, per patient's b/p monitor reading was 140/92 and ours read 140/84. Patient showed his readings for the past few weeks on his phone, since 09/17/2018 readings averaging out 130s-120s/80s-92 or 94. Patient is taking Lisinopril 40 mg 1 daily and Amlodipine 5 mg 1 1/2 tablets daily. What should patient do at this time? Patient does need a refill of Amlodipine with updated dose and directions after decision has been made.  Also patient mentioned that he has seen nephrologist and has follow up with them again on 10/13/2018. Nephrologist mentioned he may want to add a third b/p medication but have not yet. Labs were re checked and patient was advised that there were some concerns with Calcium levels been on the elevated side. Per our lab references this was normal but patient said he looked this up and saw that after 40 levels should be around 9. Patient was told they want to do US Renal but have not reached out to the patient yet to schedule. Not sure if we or nephrologist suppose to follow up on Calcium issue. We do have last office note scanned in from nephrologist.

## 2018-10-06 MED ORDER — AMLODIPINE BESYLATE 10 MG PO TABS: 10.0000 mg | ORAL_TABLET | Freq: Every day | ORAL | 2 refills | Status: DC

## 2018-10-06 NOTE — Addendum Note (Signed)
Addended by: Kris Mouton on: 10/06/2018 08:14 AM   Modules accepted: Orders

## 2018-10-06 NOTE — Telephone Encounter (Signed)
Mychart to patient.   Script sent to pharmacy and follow-up labs ordered

## 2018-10-12 ENCOUNTER — Ambulatory Visit
Admission: RE | Admit: 2018-10-12 | Discharge: 2018-10-12 | Disposition: A | Discharge status: Home / Self Care | Payer: BLUE CROSS/BLUE SHIELD | Source: Ambulatory Visit | Attending: Nephrology | Admitting: Nephrology

## 2018-10-12 DIAGNOSIS — N179 Acute kidney failure, unspecified: Secondary | ICD-10-CM | POA: Diagnosis not present

## 2018-10-18 ENCOUNTER — Ambulatory Visit (INDEPENDENT_AMBULATORY_CARE_PROVIDER_SITE_OTHER): Payer: BLUE CROSS/BLUE SHIELD | Admitting: Family Medicine

## 2018-10-18 ENCOUNTER — Encounter: Payer: Self-pay | Admitting: Family Medicine

## 2018-10-18 VITALS — BP 130/86 | HR 82 | Temp 98.4°F | Ht 67.0 in | Wt 197.8 lb

## 2018-10-18 DIAGNOSIS — I10 Essential (primary) hypertension: Secondary | ICD-10-CM

## 2018-10-18 LAB — CALCIUM: Calcium: 10.5

## 2018-10-18 LAB — ALBUMIN: Albumin: 4.9

## 2018-10-18 LAB — PTH, INTACT: PTH: 26

## 2018-10-18 NOTE — Assessment & Plan Note (Signed)
BP improved today on 2 medications. Reviewed Renal US which was reassuring. Nephrology next month will defer additional medication to that appointment

## 2018-10-18 NOTE — Patient Instructions (Signed)
Blood pressure looks better!  Keep taking medication Follow-up with Nephrology   For the calcium - I think this is OK, but if you see nephrology and still have questions > let me know and we can either do more blood work or refer you to Endocrine

## 2018-10-18 NOTE — Assessment & Plan Note (Signed)
Reviewed labs by nephrology including normal PTH. Ca 10.5 with albumin correcting to just below 10. Offered I-cal or endocrine referral but they will wait until they see nephrology

## 2018-10-18 NOTE — Progress Notes (Signed)
   Subjective:     Todd Black is a 48 y.o. male presenting for Hypertension (follow up.) and Wanted to find out results of US renal     HPI  #HTN - taking medication - was getting some readings in the 80s - today is a little high due to stress and poor sleep - no lightheadedness, dizziness, cp, vision changes - had a headache a few days ago  #Hypercalceimia  - told by the nephrologist that he was concerned - is high going back for 1 year    Review of Systems  09/06/2018: Clinic - repeat BMP improved kidney function. Slowly increase amlodipine 10/05/2018: MyChart - concern about calcium and bp still high > amlodipine 10 mg and PTH screening 10/13/2018: Renal US with 2.2 cm simple cyst otherwise normal  Social History   Tobacco Use  Smoking Status Never Smoker  Smokeless Tobacco Never Used        Objective:    BP Readings from Last 3 Encounters:  10/18/18 130/86  09/06/18 (!) 132/92  08/18/18 (!) 138/96   Wt Readings from Last 3 Encounters:  10/18/18 197 lb 12 oz (89.7 kg)  09/06/18 192 lb 4 oz (87.2 kg)  08/18/18 190 lb 4 oz (86.3 kg)    BP 130/86   Pulse 82   Temp 98.4 F (36.9 C)   Ht 5\' 7"  (1.702 m)   Wt 197 lb 12 oz (89.7 kg)   SpO2 99%   BMI 30.97 kg/m    Physical Exam Constitutional:      Appearance: Normal appearance. He is not ill-appearing or diaphoretic.  HENT:     Right Ear: External ear normal.     Left Ear: External ear normal.     Nose: Nose normal.  Eyes:     General: No scleral icterus.    Extraocular Movements: Extraocular movements intact.     Conjunctiva/sclera: Conjunctivae normal.  Neck:     Musculoskeletal: Neck supple.  Cardiovascular:     Rate and Rhythm: Normal rate and regular rhythm.     Heart sounds: No murmur.  Pulmonary:     Effort: Pulmonary effort is normal. No respiratory distress.     Breath sounds: Normal breath sounds. No wheezing.  Skin:    General: Skin is warm and dry.  Neurological:     Mental  Status: He is alert. Mental status is at baseline.  Psychiatric:        Mood and Affect: Mood normal.           Assessment & Plan:   Problem List Items Addressed This Visit      Cardiovascular and Mediastinum   HTN (hypertension) - Primary    BP improved today on 2 medications. Reviewed Renal US which was reassuring. Nephrology next month will defer additional medication to that appointment        Other   Hypercalcemia    Reviewed labs by nephrology including normal PTH. Ca 10.5 with albumin correcting to just below 10. Offered I-cal or endocrine referral but they will wait until they see nephrology          Return in about 3 months (around 01/16/2019).  Lesleigh Noe, MD

## 2018-11-09 DIAGNOSIS — I10 Essential (primary) hypertension: Secondary | ICD-10-CM | POA: Diagnosis not present

## 2018-11-09 DIAGNOSIS — N179 Acute kidney failure, unspecified: Secondary | ICD-10-CM | POA: Diagnosis not present

## 2018-11-14 ENCOUNTER — Ambulatory Visit (INDEPENDENT_AMBULATORY_CARE_PROVIDER_SITE_OTHER): Payer: BLUE CROSS/BLUE SHIELD | Admitting: Family Medicine

## 2018-11-14 ENCOUNTER — Ambulatory Visit
Admission: RE | Admit: 2018-11-14 | Discharge: 2018-11-14 | Disposition: A | Discharge status: Home / Self Care | Payer: BLUE CROSS/BLUE SHIELD | Source: Ambulatory Visit | Attending: Family Medicine | Admitting: Family Medicine

## 2018-11-14 ENCOUNTER — Encounter: Payer: Self-pay | Admitting: Physician Assistant

## 2018-11-14 ENCOUNTER — Encounter: Payer: Self-pay | Admitting: Family Medicine

## 2018-11-14 ENCOUNTER — Telehealth: Payer: Self-pay

## 2018-11-14 ENCOUNTER — Ambulatory Visit: Admission: RE | Admit: 2018-11-14 | Payer: BLUE CROSS/BLUE SHIELD | Source: Ambulatory Visit

## 2018-11-14 VITALS — BP 130/78 | HR 87 | Temp 98.3°F | Resp 16 | Ht 67.0 in | Wt 195.5 lb

## 2018-11-14 DIAGNOSIS — R1032 Left lower quadrant pain: Secondary | ICD-10-CM | POA: Insufficient documentation

## 2018-11-14 DIAGNOSIS — R197 Diarrhea, unspecified: Secondary | ICD-10-CM

## 2018-11-14 DIAGNOSIS — K625 Hemorrhage of anus and rectum: Secondary | ICD-10-CM | POA: Diagnosis not present

## 2018-11-14 DIAGNOSIS — N50811 Right testicular pain: Secondary | ICD-10-CM

## 2018-11-14 LAB — URINALYSIS, MICROSCOPIC ONLY

## 2018-11-14 LAB — POCT I-STAT CREATININE: CREATININE: 1.1 mg/dL (ref 0.61–1.24)

## 2018-11-14 MED ORDER — IOHEXOL 300 MG/ML  SOLN
100.0000 mL | Freq: Once | INTRAMUSCULAR | Status: AC | PRN
  Administered 2018-11-14: 100 mL via INTRAVENOUS

## 2018-11-14 NOTE — Progress Notes (Signed)
Subjective:     Amante Fomby is a 48 y.o. male presenting for Rectal Bleeding (started with diarrhea and nausea on 11/10/2018. Abdominal gurgling sounds present. Nausea present still. Today had a lot of blood in toilet with a little bit of bowel and one time it was just blood. No fever.)     Rectal Bleeding   The current episode started 3 to 5 days ago. The problem has been gradually worsening. The stool is described as soft, bloody and mixed with blood. Prior successful therapies include diet changes (bland diet). Associated symptoms include abdominal pain, diarrhea, hemorrhoids and nausea. Pertinent negatives include no fever, no hematemesis, no rectal pain, no vomiting and no hematuria. The diarrhea occurs 2 to 4 times per day. The diarrhea is bloody. He has been eating and drinking normally. Urine output has been normal. There were no sick contacts.   Waking from sleep on Thursday morning with diarrhea  Over the years had some rectal bleeding over the years  Central and epigastric abdominal pain  Abdominal pain 4/10 at baseline and worse with pressing on it   Testicular tenderness Worse with specific movement Seems separate from the abdominal pain No redness No fevers chills No masses or lumps No discharge  Review of Systems  Constitutional: Negative for chills and fever.  Gastrointestinal: Positive for abdominal pain, diarrhea, hematochezia, hemorrhoids and nausea. Negative for hematemesis, rectal pain and vomiting.  Genitourinary: Negative for hematuria.  Neurological: Negative for dizziness and light-headedness.   FMHx: sister with crohn's disease  Social History   Tobacco Use  Smoking Status Never Smoker  Smokeless Tobacco Never Used        Objective:    BP Readings from Last 3 Encounters:  11/14/18 130/78  10/18/18 130/86  09/06/18 (!) 132/92   Wt Readings from Last 3 Encounters:  11/14/18 195 lb 8 oz (88.7 kg)  10/18/18 197 lb 12 oz (89.7 kg)    09/06/18 192 lb 4 oz (87.2 kg)    BP 130/78   Pulse 87   Temp 98.3 F (36.8 C)   Resp 16   Ht 5\' 7"  (1.702 m)   Wt 195 lb 8 oz (88.7 kg)   SpO2 98%   BMI 30.62 kg/m    Physical Exam Exam conducted with a chaperone present.  Constitutional:      Appearance: Normal appearance. He is not ill-appearing or diaphoretic.  HENT:     Right Ear: External ear normal.     Left Ear: External ear normal.     Nose: Nose normal.  Eyes:     General: No scleral icterus.    Extraocular Movements: Extraocular movements intact.     Conjunctiva/sclera: Conjunctivae normal.  Neck:     Musculoskeletal: Neck supple.  Cardiovascular:     Rate and Rhythm: Normal rate and regular rhythm.     Heart sounds: No murmur.  Pulmonary:     Effort: Pulmonary effort is normal. No respiratory distress.     Breath sounds: Normal breath sounds. No wheezing.  Abdominal:     General: Abdomen is flat. Bowel sounds are decreased. There is no distension.     Palpations: Abdomen is soft.     Tenderness: There is abdominal tenderness in the right upper quadrant, left upper quadrant and left lower quadrant. There is no guarding or rebound. Negative signs include Murphy's sign.     Hernia: A hernia is present.  Genitourinary:    Pubic Area: No rash or pubic lice.  Penis: Normal.      Comments: Absent left testicle.  Right testicle w/o swelling. Possible mild erythema to the scrotum. No sign of torsion.  Rectal exam deferred as known hemorrhoid Skin:    General: Skin is warm and dry.  Neurological:     Mental Status: He is alert. Mental status is at baseline.  Psychiatric:        Mood and Affect: Mood normal.           Assessment & Plan:   Problem List Items Addressed This Visit    None    Visit Diagnoses    Diarrhea of presumed infectious origin    -  Primary   Relevant Orders   Ambulatory referral to Gastroenterology   Gastrointestinal Pathogen Panel PCR   Left lower quadrant abdominal pain        Relevant Orders   CT Abdomen Pelvis W Contrast   Rectal bleeding       Relevant Orders   CT Abdomen Pelvis W Contrast   Ambulatory referral to Gastroenterology   Gastrointestinal Pathogen Panel PCR   Testicular pain, right       Relevant Orders   POCT UA - Microscopic Only   US SCROTUM W/DOPPLER     Pt with 4 days of diarrheal illness with nausea which is complicated by rectal bleeding and abdominal pain. Abdominal pain is generalized but diarrhea w/ bloody stool and LLQ pain concerning for possible diverticulitis.  Stat CT ordered  Will also get stool pathogen which pt should bring in after CT if CT scan is negative to work up infectious causes given blood BM.   Also advised to call back if rectal bleeding worsening or not improving -- as given hemorrhoid hx if other work-up is negative would consider urgent eval by surgery for hemorrhoid treatment.   GI referral placed to see once recovered, given intermittent GI issues w/ and w/o blood and family history of crohn's disease.    Pt less concerned about testicular pain, exam reassuring. [place] ordered to do if not improving.   Return if symptoms worsen or fail to improve.  Lesleigh Noe, MD

## 2018-11-14 NOTE — Telephone Encounter (Signed)
Spoke with patient. Normal CT scan. Advised to bring in the stool sample

## 2018-11-14 NOTE — Patient Instructions (Signed)
See Rosaria Ferries to schedule the Ct scan  Go to the lab to get information for the stool study and to do the urine sample   If you develop fevers, chills, worsening abdominal pain, more rectal bleeding or start feeling lightheaded or dizzy call the clinic.

## 2018-11-14 NOTE — Telephone Encounter (Signed)
Stephanie with [facility] CT called report on CT abd and pelvis w contrast. Report taken to Dr Einar Pheasant and in [place]. Dr Einar Pheasant spoke with Colletta [place].

## 2018-11-17 ENCOUNTER — Telehealth: Payer: Self-pay

## 2018-11-17 NOTE — Telephone Encounter (Signed)
Patient's wife called and states that patient had to go to [place] to be with his father who is having issues with chest pain, and may be having heart surgery. Patient was not able to do the stool test yet due to having to leave suddenly. They are not sure when patient will be back. Advised wife that I would notify Dr. Einar Pheasant and that orders are good for a while and when patient comes back and wants to proceed with getting things checked we can still do that. Advised to keep [place] posted about patient if any new symptoms come up or any issues in the mean time.

## 2018-11-17 NOTE — Telephone Encounter (Signed)
Agree with plan 

## 2018-11-18 ENCOUNTER — Ambulatory Visit: Admission: RE | Admit: 2018-11-18 | Payer: BLUE CROSS/BLUE SHIELD | Source: Ambulatory Visit

## 2018-11-21 ENCOUNTER — Ambulatory Visit: Payer: BLUE CROSS/BLUE SHIELD | Admitting: Physician Assistant

## 2018-12-01 ENCOUNTER — Other Ambulatory Visit: Payer: Self-pay

## 2018-12-01 ENCOUNTER — Ambulatory Visit (INDEPENDENT_AMBULATORY_CARE_PROVIDER_SITE_OTHER): Payer: BLUE CROSS/BLUE SHIELD | Admitting: Gastroenterology

## 2018-12-01 DIAGNOSIS — R197 Diarrhea, unspecified: Secondary | ICD-10-CM

## 2018-12-01 NOTE — Progress Notes (Signed)
Note initially started for telehealth visit.  However, the patient declined the visit and asked to call back and reschedule at another time because of a scheduling conflict with work.

## 2018-12-02 ENCOUNTER — Ambulatory Visit: Payer: BLUE CROSS/BLUE SHIELD | Admitting: Gastroenterology

## 2018-12-28 ENCOUNTER — Other Ambulatory Visit: Payer: Self-pay | Admitting: Family Medicine

## 2018-12-28 DIAGNOSIS — I10 Essential (primary) hypertension: Secondary | ICD-10-CM

## 2019-02-12 ENCOUNTER — Telehealth: Payer: BLUE CROSS/BLUE SHIELD | Admitting: Physician Assistant

## 2019-02-12 DIAGNOSIS — R059 Cough, unspecified: Secondary | ICD-10-CM

## 2019-02-12 DIAGNOSIS — Z20828 Contact with and (suspected) exposure to other viral communicable diseases: Secondary | ICD-10-CM | POA: Diagnosis not present

## 2019-02-12 DIAGNOSIS — R05 Cough: Secondary | ICD-10-CM

## 2019-02-12 MED ORDER — BENZONATATE 200 MG PO CAPS: 200.0000 mg | ORAL_CAPSULE | Freq: Two times a day (BID) | ORAL | 0 refills | Status: DC | PRN

## 2019-02-12 NOTE — Progress Notes (Signed)
E-Visit for Corona Virus Screening  Based on your current symptoms, you may very well have the virus, however your symptoms are mild. Currently, not all patients are being tested. If the symptoms are mild and there is not a known exposure, performing the test is not indicated.  You have been enrolled in [place] for COVID-19. Daily you will receive a questionnaire within the [facility] website. Our COVID-19 response team will be monitoring your responses daily.   Coronavirus disease 2019 (COVID-19)is a respiratory illness that can spread from person to person. The virus that causes COVID-19 is a new virus that was first identified in the country of [place] but is now found in multiple other countries and has spread to the [place].Symptoms associated with the virus are mild to severe fever, cough, and shortness of breath. There is currently no vaccine to protect against COVID-19, and there is no specific antiviral treatment for the virus.  To be considered HIGH RISK for Coronavirus (COVID-19), you have to meet the following criteria:  . Traveled to [place], [place], [place], [place] or [place]; or in the [place] to [place], [place], [place], or [place]; and have fever, cough, and shortness of breath within the last 2 weeks of travel OR  . Been in close contact with a person diagnosed with COVID-19 within the last 2 weeks and have fever, cough, and shortness of breath  . IF YOU DO NOT MEET THESE CRITERIA, YOU ARE CONSIDERED LOW RISK FOR COVID-19.   It is vitally important that if you feel that you have an infection such as this virus or any other virus that you stay home and away from places where you may spread it to others.You should self-quarantine for 14 days if you have symptoms that could potentially be coronavirus and avoid contact with people age 70 and older.     You may also take acetaminophen (Tylenol) as needed for fever. I have prescribed tessalon pearles  for cough.    Reduce your risk of any infection by using the same precautions used for avoiding the common cold or flu: Wash your hands often with soap and warm water for at least 20 seconds.If soap and water are not readily available, use an alcohol-based hand sanitizer with at least 60% alcohol.  If coughing or sneezing, cover your mouth and nose by coughing or sneezing into the elbow areas of your shirt or coat, into a tissue or into your sleeve (not your hands). Avoid shaking hands with others and consider head nods or verbal greetings only.  Avoid touching your eyes,nose, or mouth with unwashed hands. Avoid close contact with people who are sick. Avoid places or events with large numbers of people in one location, like concerts or sporting events. Carefully consider travel plans you have or are making. If you are planning any travel outside or inside the [place], visit the CDC'sTravelers' Health webpagefor the latest health notices. If you have some symptoms but not all symptoms, continue to monitor at home and seek medical attention if your symptoms worsen. If you are having a medical emergency, call 911.  HOME CARE Only take medications as instructed by your medical team. Drink plenty of fluids and get plenty of rest. A steam or ultrasonic humidifier can help if you have congestion.   GET HELP RIGHT AWAY IF: You develop worsening fever. You become short of breath You cough up blood. Your symptoms become more severe MAKE SURE YOU  Understand these instructions.  Will watch your condition. Will get help right away if you are not doing well or get worse.  Your e-visit answers were reviewed by a board certified advanced clinical practitioner to complete your personal care plan.Depending on the condition, your plan could have included both over the counter or prescription medications.If there is a problem please reply once you have received a response from your provider. Your  safety is important to [place].If you have drug allergies check your prescription carefully.  You can use MyChart to ask questions about today's visit, request a non-urgent call back, or ask for a work or school excuse for 24 hours related to this e-Visit. If it has been greater than 24 hours you will need to follow up with your provider, or enter a new e-Visit to address those concerns. You will get an e-mail in the next two days asking about your experience.I hope that your e-visit has been valuable and will speed your recovery. Thank you for using e-visits.     ----- Message -----  From: Earlene Plater  Sent: 6/7/20202:30 PM EDT  To: E-Visit Mailing List Subject: E-Visit Submission: CoronaVirus (UDJSH-70) Screening  E-Visit Submission: CoronaVirus ([id number]) Screening --------------------------------  Question: Do you have any of the following?  Answer: Shortness of breath  Cough  Fever  Question: If you are experiencing trouble breathing please select the severity of this:  Answer: I have mild trouble breathing but not very often  Question: Do you have any of the following additional symptoms?  Answer: Sore throat  Stuffy nose  Body aches  Headache  Question: Have you had a fever? Answer: Yes  Question: If you are running a fever, please type in your temperature reading Answer: Low grade never above 99.3  Question: How long have you had the fever? Answer: For a few days  Question: Have others in your home or workplace had similar symptoms? Answer: No  Question: When did your symptoms start? Answer: Thursday night  Question: Have you recently visited any of the following countries? Answer: None of these  Question: If you have traveled anywhere in the last2 months please document where you have visited: Answer:   Question: Have you recently been around others from these countries  or visited these countries who have had coughing or fever? Answer: No  Question: Have you recently been around anyone who has been diagnosed with Corona virus? Answer: No  Question: Have you been taking any medications? Answer: Yes  Question: If taking medications for these symptoms, please list the names and whether they are helping or not Answer: Lisinopril and amlodipine  Question: Are you treated for any of the following conditions: Asthma, COPD, Diabetes, Renal Failure (on Dialysis), AIDS, any Neuromuscular disease that effects the clearing of secretions, Heart Failure, or Heart Disease? Answer: No  Question: Please enter a phone number where you can be reached if we have additional questions about your symptoms Answer: [phone]  Question: Please list your medication allergies that you may have ? (If 'none' , please list as 'none') Answer: None  Question: Please list any additional comments  Answer:   A total of 5-10 minutes was spent evaluating this patients questionnaire and formulating a plan of care.

## 2019-02-13 ENCOUNTER — Encounter: Payer: Self-pay | Admitting: Family Medicine

## 2019-02-13 ENCOUNTER — Telehealth: Payer: Self-pay

## 2019-02-13 NOTE — Telephone Encounter (Signed)
Routing to MA to add patient to COVID symptom follow-up

## 2019-02-13 NOTE — Telephone Encounter (Signed)
[facility] Night - Client TELEPHONE ADVICE RECORD [facility] Patient Name: Todd Black Gender: Male DOB: [birth year] Age: 48 Y 70 M 14 D Return Phone Number: [phone] (Primary), [phone] (Secondary) Address: City/State/Zip: [place] [address] Client [facility] Night - Client Client Site [facility] Physician Waunita Schooner- MD Contact Type Call Who Is Calling Patient / Member / Family / Caregiver Call Type Triage / Clinical Caller Name Trigo Winterbottom Relationship To Patient Spouse Return Phone Number [phone] (Primary) Chief Complaint BREATHING - fast, heavy or wheezing Reason for Call Symptomatic / Request for [place] states her husband has been experiencing a low grade fever, fatigue and heavy breathing when walking, sore throat and sneezing. Translation No Nurse Assessment Nurse: Ysidro Evert, RN, Levada Dy Date/Time (Eastern Time): 02/11/2019 4:06:54 PM Confirm and document reason for call. If symptomatic, describe symptoms. ---Caller states she is having fatigue and he had a fever yesterday but none today. He has a sore throat that started today. Has the patient had close contact with a person known or suspected to have the novel coronavirus illness OR traveled / lives in area with major community spread (including international travel) in the last 14 days from the onset of symptoms? * If Asymptomatic, screen for exposure and travel within the last 14 days. ---Yes Does the patient have any new or worsening symptoms? ---Yes Will a triage be completed? ---Yes Related visit to physician within the last 2 weeks? ---No Does the PT have any chronic conditions? (i.e. diabetes, asthma, this includes High risk factors for pregnancy, etc.) ---Yes List chronic conditions. ---hypertension Is this a behavioral health or substance abuse call?  ---No Guidelines Guideline Title Affirmed Question Affirmed Notes Nurse Date/Time (Eastern Time) Coronavirus (COVID-19) - Diagnosed or Suspected [1] COVID-19 infection suspected by caller or triager AND [2] mild symptoms (cough, fever, or others) AND [0] no complications or SOB Ysidro Evert, RN, Levada Dy 02/11/2019 4:09:37 PM PLEASE NOTE: All timestamps contained within this report are represented as [place] Standard Time. CONFIDENTIALTY NOTICE: This fax transmission is intended only for the addressee. It contains information that is legally privileged, confidential or otherwise protected from use or disclosure. If you are not the intended recipient, you are strictly prohibited from reviewing, disclosing, copying using or disseminating any of this information or taking any action in reliance on or regarding this information. If you have received this fax in error, please notify us immediately by telephone so that we can arrange for its return to [place]. Phone: [phone], Toll-Free: [phone], Fax: [phone] Page: 2 of 2 Call Id: [id number] Marquez. Time Eilene Ghazi Time) Disposition Final User 02/11/2019 4:05:10 PM Send to Urgent Zannie Kehr 02/11/2019 4:14:51 PM Call PCP when Office is Open Yes Ysidro Evert, RN, Marin Shutter Disagree/Comply Comply Caller Understands Yes PreDisposition Did not know what to do Care Advice Given Per Guideline CALL PCP WHEN OFFICE IS OPEN: * You need to discuss this with your doctor (or NP/PA) within the next few days. REASSURANCE AND EDUCATION - SUSPECTED COVID-19: * You suspect you have COVID-19 because you have symptoms that match and you were either exposed to someone with it or because it widespread in your community. * Most people who get COVID-19 will have mild illness, can recover at home without medical care, and may not need to be tested. From what you have told me, your symptoms are mild. That is reassuring. PAIN OR FEVER MEDICINES: * For pain and fever  relief, take acetaminophen or ibuprofen. * Treat fevers above 101 F (38.3 C). * The goal of fever therapy is to bring the fever down to a comfortable level. Remember that fever medicine usually lowers fever 2-3 F (1-1.5 C). ACETAMINOPHEN (E.G., TYLENOL): * Take 650 mg (two 325 mg pills) by mouth every 4-6 hours as needed. Each Regular Strength Tylenol pill has 325 mg of acetaminophen. The most you should take each day is 3,250 mg (10 Regular Strength pills a day). CALL BACK IF: * Fever over 103 F (39.4 C) * Fever lasts over 3 days * Chest pain or difficulty breathing occurs * You become worse. CARE ADVICE given per CORONAVIRUS (COVID-19) - DIAGNOSED OR SUSPECTED (Adult) guideline. Referrals REFERRED TO PCP OFFICE

## 2019-02-13 NOTE — Telephone Encounter (Signed)
Per chart review tab pt had e visit on 02/12/19.

## 2019-02-14 DIAGNOSIS — R0789 Other chest pain: Secondary | ICD-10-CM | POA: Diagnosis not present

## 2019-02-14 NOTE — Telephone Encounter (Signed)
Thank you for the follow-up.   Reassuring that the urgent care provider did not think he needed to be in the ER  Please add patient to the [facility] follow-up list and check back on symptoms in 2-3 days.

## 2019-02-14 NOTE — Telephone Encounter (Signed)
Spoke with patient. Patient saw a PA at [facility]. Xray was done and patient was told that it did not seem to be pneumonia, PA thought patient was probably positive for COVID due to the amount of inflammation seen in the lungs. On the exam PA heard crackling in his LLQ of his lungs. One of the tonsils was more swollen than the other. NO fever. Patient was giving Albuterol inhaler to use but he has not done this yet. Result of COVID should be in by Thursday at the latest. Patient feels overall about the same but no longer has a tickle in his throat. He does get more short of breath and feeling bad with moving around but better with resting. Patient is trying to take deep breaths as he can. He is still having chest pain even with resting when he breaths. Gets a headache off and on mainly with exertion. FYI to Dr. Einar Pheasant.

## 2019-02-14 NOTE — Telephone Encounter (Signed)
Spoke with patient. Does not have results of COVID test yet. Patient is at [facility] in [place] now. Still having chest pain. Patient could not talk long due to he was been called back to the room now. Asked patient to call us back to give [place] update after his visit.

## 2019-02-14 NOTE — Telephone Encounter (Signed)
Pt called back and is requesting a cb. Pt had chest x-ray and PA that evaluated him saw a lot inflammation in the lungs and is pretty sure he has covid. Test is still not bad.

## 2019-02-15 ENCOUNTER — Encounter: Payer: Self-pay | Admitting: Family Medicine

## 2019-02-15 NOTE — Telephone Encounter (Signed)
Mychart from patient today sent to Dr. Einar Pheasant also.

## 2019-02-16 NOTE — Telephone Encounter (Signed)
Spoke with patient to get an update. Patient feels better. Patient is moving around more. Still breathing heavy but not like it was. Chest pain about 50% better. No fever. Cough off and on-productive with yellowish phlegm. No more scratchy throat, voice sounds better. Used inhaler once yesterday.

## 2019-02-16 NOTE — Telephone Encounter (Signed)
Appreciate update. Seems patient is improving.

## 2019-03-09 DIAGNOSIS — S76012A Strain of muscle, fascia and tendon of left hip, initial encounter: Secondary | ICD-10-CM | POA: Diagnosis not present

## 2019-05-01 ENCOUNTER — Other Ambulatory Visit: Payer: Self-pay

## 2019-05-01 ENCOUNTER — Encounter: Payer: Self-pay | Admitting: Internal Medicine

## 2019-05-01 ENCOUNTER — Ambulatory Visit (INDEPENDENT_AMBULATORY_CARE_PROVIDER_SITE_OTHER): Payer: BC Managed Care – PPO | Admitting: Internal Medicine

## 2019-05-01 DIAGNOSIS — B349 Viral infection, unspecified: Secondary | ICD-10-CM | POA: Diagnosis not present

## 2019-05-01 NOTE — Progress Notes (Signed)
Subjective:    Patient ID: Todd Black, male    DOB: 14-Jun-1971, 48 y.o.   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 amLODipine (NORVASC) 10 MG tablet TAKE 1 TABLET(10 MG) BY MOUTH DAILY 90 tablet 1  . ibuprofen (ADVIL,MOTRIN) 200 MG tablet Take 200 mg by mouth every 6 (six) hours as needed.    Marland Kitchen lisinopril (PRINIVIL,ZESTRIL) 40 MG tablet Take 1 tablet (40 mg total) by mouth daily. 90 tablet 3   No current facility-administered medications on file prior to visit.     Allergies  Allergen Reactions  . Bee Venom     Past Medical History:  Diagnosis Date  . Arthritis   . History of bloody stools   . History of dermoid cyst excision   . Hyperlipidemia   . Hypertension     Past Surgical History:  Procedure Laterality Date  . DERMOID CYST  EXCISION    . LIVER BIOPSY    . RADICAL ORCHIECTOMY  1999  . SHOULDER ARTHROSCOPY Right 2005   Dr Judieth Keens in [place]    Family History  Problem Relation Age of Onset  . Arthritis Father   . Hyperlipidemia Father   . Hypertension Father   . Skin cancer Father        non-melanoma  . Crohn's disease Sister    . Supraventricular tachycardia Brother   . Heart attack Maternal Grandfather 33    Social History   Socioeconomic History  . Marital status: Married    Spouse name: Santiago Glad  . Number of children: 5  . Years of education: Associates degree  . Highest education level: Not on file  Occupational History  . Not on file  Social Needs  . Financial resource strain: Not hard at all  . Food insecurity    Worry: Not on file    Inability: Not on file  . Transportation needs    Medical: Not on file    Non-medical: Not on file  Tobacco Use  . Smoking status: Never Smoker  . Smokeless tobacco: Never Used  Substance and Sexual Activity  . Alcohol use: No  . Drug use: No  . Sexual activity: Yes    Birth control/protection: Other-see comments    Comment: wife with hx of cancer  Lifestyle  . Physical activity    Days per week: Not on file    Minutes per session: Not on file  . Stress: Not on file  Relationships  . Social Herbalist on phone: Not on file    Gets together: Not on file    Attends religious service: Not on file    Active  member of club or organization: Not on file    Attends meetings of clubs or organizations: Not on file    Relationship status: Not on file  . Intimate partner violence    Fear of current or ex partner: Not on file    Emotionally abused: Not on file    Physically abused: Not on file    Forced sexual activity: Not on file  Other Topics Concern  . 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 No distress.  Neck: No thyromegaly  present.  Respiratory: Effort normal. No respiratory distress.  Lymphadenopathy:    He has no cervical adenopathy.  Psychiatric: He has a normal mood and affect. His behavior is normal.           Assessment & Plan:

## 2019-05-01 NOTE — Assessment & Plan Note (Signed)
Highly suspicious for COVID Will try to stay distant from family and they will wear masks He will do drive by testing tomorrow morning at [place] to stay out of work till symptoms resolve--even if COVID negative His workplace still uses 10/3 plan for return if COVID is positive Discussed symptomatic treatment (ibuprofen fine if that works for him) Reviewed ER evaluation if sig distress with SOB (discussed potential time frame for that)

## 2019-05-02 ENCOUNTER — Other Ambulatory Visit: Payer: Self-pay

## 2019-05-02 DIAGNOSIS — R6889 Other general symptoms and signs: Secondary | ICD-10-CM | POA: Diagnosis not present

## 2019-05-02 DIAGNOSIS — Z20822 Contact with and (suspected) exposure to covid-19: Secondary | ICD-10-CM

## 2019-05-03 LAB — NOVEL CORONAVIRUS, NAA: SARS-CoV-2, NAA: NOT DETECTED

## 2019-05-03 LAB — SPECIMEN STATUS REPORT

## 2019-06-06 ENCOUNTER — Ambulatory Visit: Payer: BC Managed Care – PPO

## 2019-06-08 ENCOUNTER — Ambulatory Visit (INDEPENDENT_AMBULATORY_CARE_PROVIDER_SITE_OTHER): Payer: BC Managed Care – PPO

## 2019-06-08 DIAGNOSIS — Z23 Encounter for immunization: Secondary | ICD-10-CM | POA: Diagnosis not present

## 2019-08-27 ENCOUNTER — Other Ambulatory Visit: Payer: Self-pay | Admitting: Family Medicine

## 2019-08-27 DIAGNOSIS — I10 Essential (primary) hypertension: Secondary | ICD-10-CM

## 2019-08-28 NOTE — Telephone Encounter (Signed)
Left detailed message for patient to call back to schedule F/U or CPE in March 2021.

## 2019-08-28 NOTE — Telephone Encounter (Signed)
LOV 05/01/2019 with Dr. Silvio Pate for acute visit. LOV with Dr Einar Pheasant on 11/14/2018. When do you want to follow up with patient again?

## 2019-08-28 NOTE — Telephone Encounter (Signed)
Should schedule visit by March for blood work and follow-up or annual visit.

## 2020-05-28 ENCOUNTER — Ambulatory Visit (INDEPENDENT_AMBULATORY_CARE_PROVIDER_SITE_OTHER): Payer: BC Managed Care – PPO | Admitting: Family Medicine

## 2020-05-28 ENCOUNTER — Other Ambulatory Visit: Payer: Self-pay

## 2020-05-28 VITALS — BP 122/90 | HR 75 | Temp 98.0°F | Ht 65.75 in | Wt 197.8 lb

## 2020-05-28 DIAGNOSIS — Z1159 Encounter for screening for other viral diseases: Secondary | ICD-10-CM | POA: Diagnosis not present

## 2020-05-28 DIAGNOSIS — E785 Hyperlipidemia, unspecified: Secondary | ICD-10-CM | POA: Diagnosis not present

## 2020-05-28 DIAGNOSIS — I10 Essential (primary) hypertension: Secondary | ICD-10-CM

## 2020-05-28 DIAGNOSIS — Z1211 Encounter for screening for malignant neoplasm of colon: Secondary | ICD-10-CM | POA: Diagnosis not present

## 2020-05-28 DIAGNOSIS — Z23 Encounter for immunization: Secondary | ICD-10-CM | POA: Diagnosis not present

## 2020-05-28 DIAGNOSIS — Z0000 Encounter for general adult medical examination without abnormal findings: Secondary | ICD-10-CM | POA: Diagnosis not present

## 2020-05-28 LAB — COMPREHENSIVE METABOLIC PANEL
ALT: 48 U/L (ref 0–53)
AST: 26 U/L (ref 0–37)
Albumin: 4.6 g/dL (ref 3.5–5.2)
Alkaline Phosphatase: 104 U/L (ref 39–117)
BUN: 16 mg/dL (ref 6–23)
CO2: 30 mEq/L (ref 19–32)
Calcium: 9.9 mg/dL (ref 8.4–10.5)
Chloride: 103 mEq/L (ref 96–112)
Creatinine, Ser: 1.21 mg/dL (ref 0.40–1.50)
GFR: 63.29 mL/min (ref >60.00)
Glucose, Bld: 99 mg/dL (ref 70–99)
Potassium: 4.7 mEq/L (ref 3.5–5.1)
Sodium: 138 mEq/L (ref 135–145)
Total Bilirubin: 0.8 mg/dL (ref 0.2–1.2)
Total Protein: 7.3 g/dL (ref 6.0–8.3)

## 2020-05-28 LAB — LIPID PANEL
Cholesterol: 181 mg/dL (ref 0–200)
HDL: 35.1 mg/dL — ABNORMAL LOW (ref >39.00)
LDL Cholesterol: 131 mg/dL — ABNORMAL HIGH (ref 0–99)
NonHDL: 145.92
Total CHOL/HDL Ratio: 5
Triglycerides: 77 mg/dL (ref 0.0–149.0)
VLDL: 15.4 mg/dL (ref 0.0–40.0)

## 2020-05-28 MED ORDER — AMLODIPINE BESYLATE 10 MG PO TABS: ORAL_TABLET | ORAL | 3 refills | Status: DC

## 2020-05-28 MED ORDER — LISINOPRIL 40 MG PO TABS: 40.0000 mg | ORAL_TABLET | Freq: Every day | ORAL | 3 refills | Status: DC

## 2020-05-28 NOTE — Progress Notes (Signed)
Annual Exam   Chief Complaint:  Chief Complaint  Patient presents with  . Annual Exam    will give [place] covid shot dates when he leaves     History of Present Illness:  Todd Black is a 49 y.o. presents today for annual examination.       Nutrition/Lifestyle Diet: good, healthy eating  Exercise: tennis three times a week He is single partner, contraception - status post hysterectomy.  Any issues with getting or keeping erection? No  Social History   Tobacco Use  Smoking Status Never Smoker  Smokeless Tobacco Never Used   Social History   Substance and Sexual Activity  Alcohol Use No   Social History   Substance and Sexual Activity  Drug Use No     Safety The patient wears seatbelts: yes.     The patient feels safe at home and in their relationships: yes.  General Health Dentist in the last year: No  - will make an appointment Eye doctor: yes  Weight Wt Readings from Last 3 Encounters:  05/28/20 197 lb 12 oz (89.7 kg)  11/14/18 195 lb 8 oz (88.7 kg)  10/18/18 197 lb 12 oz (89.7 kg)   Patient has high BMI  BMI Readings from Last 1 Encounters:  05/28/20 32.16 kg/m     Chronic disease screening Blood pressure monitoring:  BP Readings from Last 3 Encounters:  05/28/20 122/90  05/01/19 139/88  11/14/18 130/78    Lipid Monitoring: Indication for screening: age >35, obesity, diabetes, family hx, CV risk factors.  Lipid screening: Yes  No results found for: CHOL, HDL, LDLCALC, LDLDIRECT, TRIG, CHOLHDL   Diabetes Screening: age >80, overweight, family hx, PCOS, hx of gestational diabetes, at risk ethnicity, elevated blood pressure >135/80.  Diabetes Screening screening: Yes  No results found for: HGBA1C     Colon Cancer Screening:  Age 42-75 yo - benefits outweigh the risk. Adults 23-85 yo who have never been screened benefit.  Benefits: 134000 people in 2016 will be diagnosed and 49,000 will die - early detection helps [place]: Complications  2/2 to colonoscopy High Risk (Colonoscopy): genetic disorder (Lynch syndrome or familial adenomatous polyposis), personal hx of IBD, previous adenomatous polyp, or previous colorectal cancer, FamHx start 10 years before the age at diagnosis, increased in males and black race  Options:  FIT - looks for hemoglobin (blood in the stool) - specific and fairly sensitive - must be done annually Cologuard - looks for DNA and blood - more sensitive - therefore can have more false positives, every 3 years Colonoscopy - every 10 years if normal - sedation, bowl prep, must have someone drive you  Shared decision making and the patient had decided to do think about his options and return.   Social History   Tobacco Use  Smoking Status Never Smoker  Smokeless Tobacco Never Used     Past Medical History:  Diagnosis Date  . Arthritis   . History of bloody stools   . History of dermoid cyst excision   . Hyperlipidemia   . Hypertension     Past Surgical History:  Procedure Laterality Date  . DERMOID CYST  EXCISION    . LIVER BIOPSY    . RADICAL ORCHIECTOMY  1999  . SHOULDER ARTHROSCOPY Right 2005   Dr Judieth Keens in [place]    Prior to Admission medications   Medication Sig Start Date End Date Taking? Authorizing Provider  amLODipine (NORVASC) 10 MG tablet TAKE 1 TABLET(10 MG) BY MOUTH  DAILY 08/28/19  Yes Lesleigh Noe, MD  ibuprofen (ADVIL,MOTRIN) 200 MG tablet Take 200 mg by mouth every 6 (six) hours as needed.   Yes [provider]  lisinopril (PRINIVIL,ZESTRIL) 40 MG tablet Take 1 tablet (40 mg total) by mouth daily. 09/06/18  Yes Lesleigh Noe, MD    Allergies  Allergen Reactions  . Bee Venom      Social History   Socioeconomic History  . Marital status: Married    Spouse name: Santiago Glad  . Number of children: 5  . Years of education: Associates degree  . Highest education level: Not on file  Occupational History  . Not on file  Tobacco Use  . Smoking  status: Never Smoker  . Smokeless tobacco: Never Used  Vaping Use  . Vaping Use: Never used  Substance and Sexual Activity  . Alcohol use: No  . Drug use: No  . Sexual activity: Yes    Birth control/protection: Other-see comments    Comment: wife with hx of cancer  Other Topics Concern  . Not on file  Social History Narrative   Married to Santiago Glad   They have 5 children - 2 in college and 3 at home still    Enjoys: playing tennis   Exercise: Tennis when warm 2-3 times per week, winter - gets 15-20,000 steps per day   Diet: typical [place]: work at a [employer] is stressful - also travels   Scientist, physiological Strain:   . Difficulty of Paying Living Expenses: Not on file  Food Insecurity:   . Worried About Charity fundraiser in the Last Year: Not on file  . Ran Out of Food in the Last Year: Not on file  Transportation Needs:   . Lack of Transportation (Medical): Not on file  . Lack of Transportation (Non-Medical): Not on file  Physical Activity:   . Days of Exercise per Week: Not on file  . Minutes of Exercise per Session: Not on file  Stress:   . Feeling of Stress : Not on file  Social Connections:   . Frequency of Communication with Friends and Family: Not on file  . Frequency of Social Gatherings with Friends and Family: Not on file  . Attends Religious Services: Not on file  . Active Member of Clubs or Organizations: Not on file  . Attends Archivist Meetings: Not on file  . Marital Status: Not on file  Intimate Partner Violence:   . Fear of Current or Ex-Partner: Not on file  . Emotionally Abused: Not on file  . Physically Abused: Not on file  . Sexually Abused: Not on file    Family History  Problem Relation Age of Onset  . Arthritis Father   . Hyperlipidemia Father   . Hypertension Father   . Skin cancer Father        non-melanoma  . Crohn's disease Sister   . Supraventricular tachycardia Brother   .  Heart attack Maternal Grandfather 60    Review of Systems  Constitutional: Negative for chills and fever.  HENT: Negative for congestion and sore throat.   Eyes: Negative for blurred vision and double vision.  Respiratory: Negative for shortness of breath.   Cardiovascular: Negative for chest pain.  Gastrointestinal: Negative for heartburn, nausea and vomiting.  Genitourinary: Negative.   Musculoskeletal: Positive for joint pain (left shoulder). Negative for myalgias.  Skin: Negative for rash.  Neurological: Negative for dizziness  and headaches.  Endo/Heme/Allergies: Does not bruise/bleed easily.  Psychiatric/Behavioral: Negative for depression. The patient is not nervous/anxious.      Physical Exam BP 122/90   Pulse 75   Temp 98 F (36.7 C) (Temporal)   Ht 5' 5.75" (1.67 m)   Wt 197 lb 12 oz (89.7 kg)   SpO2 97%   BMI 32.16 kg/m    BP Readings from Last 3 Encounters:  05/28/20 122/90  05/01/19 139/88  11/14/18 130/78      Physical Exam Constitutional:      General: He is not in acute distress.    Appearance: He is well-developed. He is not diaphoretic.  HENT:     Head: Normocephalic and atraumatic.     Right Ear: Tympanic membrane and ear canal normal.     Left Ear: Tympanic membrane and ear canal normal.     Nose: Nose normal.     Mouth/Throat:     Pharynx: Uvula midline.  Eyes:     General: No scleral icterus.    Conjunctiva/sclera: Conjunctivae normal.     Pupils: Pupils are equal, round, and reactive to light.  Cardiovascular:     Rate and Rhythm: Normal rate and regular rhythm.     Heart sounds: Normal heart sounds. No murmur heard.   Pulmonary:     Effort: Pulmonary effort is normal. No respiratory distress.     Breath sounds: Normal breath sounds. No wheezing.  Abdominal:     General: Bowel sounds are normal. There is no distension.     Palpations: Abdomen is soft. There is no mass.     Tenderness: There is no abdominal tenderness. There is no  guarding.  Musculoskeletal:        General: Normal range of motion.     Cervical back: Normal range of motion and neck supple.  Lymphadenopathy:     Cervical: No cervical adenopathy.  Skin:    General: Skin is warm and dry.     Capillary Refill: Capillary refill takes less than 2 seconds.  Neurological:     Mental Status: He is alert and oriented to person, place, and time.        Results:  PHQ-9:    Office Visit from 08/11/2018 in [place] at [facility]  PHQ-9 Total Score 0        Assessment: 49 y.o. here for routine annual physical examination.  Plan: Problem List Items Addressed This Visit      Cardiovascular and Mediastinum   HTN (hypertension)   Relevant Medications   amLODipine (NORVASC) 10 MG tablet   lisinopril (ZESTRIL) 40 MG tablet   Other Relevant Orders   Comprehensive metabolic panel     Other   Hyperlipidemia   Relevant Medications   amLODipine (NORVASC) 10 MG tablet   lisinopril (ZESTRIL) 40 MG tablet   Other Relevant Orders   Lipid panel    Other Visit Diagnoses    Annual physical exam    -  Primary   Need for influenza vaccination       Relevant Orders   Flu Vaccine QUAD 36+ mos IM (Completed)   Need for hepatitis C screening test       Relevant Orders   Hepatitis C antibody   Screening for colon cancer       Relevant Orders   Fecal occult blood, imunochemical      Screening: -- Blood pressure screen normal -- cholesterol screening: will obtain -- Weight screening: overweight: continue to monitor --  Diabetes Screening: will obtain -- Nutrition: normal - Encouraged healthy diet  The ASCVD Risk score Mikey Bussing DC Jr., et al., 2013) failed to calculate for the following reasons:   Cannot find a previous HDL lab   Cannot find a previous total cholesterol lab  -- ASA 81 mg discussed if CVD risk >10% age 33-59 and willing to take for 10 years -- Statin therapy for Age 31-75 with CVD risk >7.5%  Psych -- Depression  screening (PHQ-9): negative  Safety -- tobacco screening: not using -- alcohol screening:  low-risk usage. -- no evidence of domestic violence or intimate partner violence.  Cancer Screening -- Prostate (age 80-69) not indicated -- Colon (age 30-75) undecided -- Lung not indicated   Immunizations Immunization History  Administered Date(s) Administered  . Influenza,inj,Quad PF,6+ Mos 08/11/2018, 06/08/2019, 05/28/2020  . Tdap 09/06/2018    -- flu vaccine up to date -- TDAP q10 years up to date -- Covid-19 Vaccine up to date  Encouraged regular vision and dental screening. Encouraged healthy exercise and diet.   Lesleigh Noe

## 2020-05-28 NOTE — Patient Instructions (Signed)
Keep me posted if you want to see physical therapy Or if decide you want to do a colonoscopy   Preventive Care 30-51 Years Old, Male Preventive care refers to lifestyle choices and visits with your health care provider that can promote health and wellness. This includes:  A yearly physical exam. This is also called an annual well check.  Regular dental and eye exams.  Immunizations.  Screening for certain conditions.  Healthy lifestyle choices, such as eating a healthy diet, getting regular exercise, not using drugs or products that contain nicotine and tobacco, and limiting alcohol use. What can I expect for my preventive care visit? Physical exam Your health care provider will check:  Height and weight. These may be used to calculate body mass index (BMI), which is a measurement that tells if you are at a healthy weight.  Heart rate and blood pressure.  Your skin for abnormal spots. Counseling Your health care provider may ask you questions about:  Alcohol, tobacco, and drug use.  Emotional well-being.  Home and relationship well-being.  Sexual activity.  Eating habits.  Work and work Statistician. What immunizations do I need?  Influenza (flu) vaccine  This is recommended every year. Tetanus, diphtheria, and pertussis (Tdap) vaccine  You may need a Td booster every 10 years. Varicella (chickenpox) vaccine  You may need this vaccine if you have not already been vaccinated. Zoster (shingles) vaccine  You may need this after age 74. Measles, mumps, and rubella (MMR) vaccine  You may need at least one dose of MMR if you were born in 1957 or later. You may also need a second dose. Pneumococcal conjugate (PCV13) vaccine  You may need this if you have certain conditions and were not previously vaccinated. Pneumococcal polysaccharide (PPSV23) vaccine  You may need one or two doses if you smoke cigarettes or if you have certain conditions. Meningococcal  conjugate (MenACWY) vaccine  You may need this if you have certain conditions. Hepatitis A vaccine  You may need this if you have certain conditions or if you travel or work in places where you may be exposed to hepatitis A. Hepatitis B vaccine  You may need this if you have certain conditions or if you travel or work in places where you may be exposed to hepatitis B. Haemophilus influenzae type b (Hib) vaccine  You may need this if you have certain risk factors. Human papillomavirus (HPV) vaccine  If recommended by your health care provider, you may need three doses over 6 months. You may receive vaccines as individual doses or as more than one vaccine together in one shot (combination vaccines). Talk with your health care provider about the risks and benefits of combination vaccines. What tests do I need? Blood tests  Lipid and cholesterol levels. These may be checked every 5 years, or more frequently if you are over 19 years old.  Hepatitis C test.  Hepatitis B test. Screening  Lung cancer screening. You may have this screening every year starting at age 37 if you have a 30-pack-year history of smoking and currently smoke or have quit within the past 15 years.  Prostate cancer screening. Recommendations will vary depending on your family history and other risks.  Colorectal cancer screening. All adults should have this screening starting at age 38 and continuing until age 44. Your health care provider may recommend screening at age 61 if you are at increased risk. You will have tests every 1-10 years, depending on your results and the  type of screening test.  Diabetes screening. This is done by checking your blood sugar (glucose) after you have not eaten for a while (fasting). You may have this done every 1-3 years.  Sexually transmitted disease (STD) testing. Follow these instructions at home: Eating and drinking  Eat a diet that includes fresh fruits and vegetables, whole  grains, lean protein, and low-fat dairy products.  Take vitamin and mineral supplements as recommended by your health care provider.  Do not drink alcohol if your health care provider tells you not to drink.  If you drink alcohol: ? Limit how much you have to 0-2 drinks a day. ? Be aware of how much alcohol is in your drink. In the [place] one drink equals one 12 oz bottle of beer (355 mL), one 5 oz glass of wine (148 mL), or one 1 oz glass of hard liquor (44 mL). Lifestyle  Take daily care of your teeth and gums.  Stay active. Exercise for at least 30 minutes on 5 or more days each week.  Do not use any products that contain nicotine or tobacco, such as cigarettes, e-cigarettes, and chewing tobacco. If you need help quitting, ask your health care provider.  If you are sexually active, practice safe sex. Use a condom or other form of protection to prevent STIs (sexually transmitted infections).  Talk with your health care provider about taking a low-dose aspirin every day starting at age 67. What's next?  Go to your health care provider once a year for a well check visit.  Ask your health care provider how often you should have your eyes and teeth checked.  Stay up to date on all vaccines. This information is not intended to replace advice given to you by your health care provider. Make sure you discuss any questions you have with your health care provider. Document Revised: 08/18/2018 Document Reviewed: 08/18/2018 Elsevier Patient Education  2020 [employer].

## 2020-05-29 LAB — HEPATITIS C ANTIBODY
Hepatitis C Ab: NONREACTIVE
SIGNAL TO CUT-OFF: 0.06 (ref <1.00)

## 2020-11-11 ENCOUNTER — Emergency Department: Payer: BC Managed Care – PPO

## 2020-11-11 ENCOUNTER — Other Ambulatory Visit: Payer: Self-pay

## 2020-11-11 ENCOUNTER — Emergency Department
Admission: EM | Admit: 2020-11-11 | Discharge: 2020-11-11 | Disposition: A | Discharge status: Home / Self Care | Payer: BC Managed Care – PPO | Attending: Emergency Medicine | Admitting: Emergency Medicine

## 2020-11-11 DIAGNOSIS — Z79899 Other long term (current) drug therapy: Secondary | ICD-10-CM | POA: Insufficient documentation

## 2020-11-11 DIAGNOSIS — R1031 Right lower quadrant pain: Secondary | ICD-10-CM | POA: Insufficient documentation

## 2020-11-11 DIAGNOSIS — I10 Essential (primary) hypertension: Secondary | ICD-10-CM | POA: Diagnosis not present

## 2020-11-11 DIAGNOSIS — N281 Cyst of kidney, acquired: Secondary | ICD-10-CM | POA: Insufficient documentation

## 2020-11-11 DIAGNOSIS — R109 Unspecified abdominal pain: Secondary | ICD-10-CM

## 2020-11-11 LAB — BASIC METABOLIC PANEL
Anion gap: 9 (ref 5–15)
BUN: 15 mg/dL (ref 6–20)
CO2: 24 mmol/L (ref 22–32)
Calcium: 9.7 mg/dL (ref 8.9–10.3)
Chloride: 103 mmol/L (ref 98–111)
Creatinine, Ser: 1.15 mg/dL (ref 0.61–1.24)
GFR, Estimated: >60 mL/min (ref >60)
Glucose, Bld: 93 mg/dL (ref 70–99)
Potassium: 4 mmol/L (ref 3.5–5.1)
Sodium: 136 mmol/L (ref 135–145)

## 2020-11-11 LAB — CBC
HCT: 48.6 % (ref 39.0–52.0)
Hemoglobin: 16.3 g/dL (ref 13.0–17.0)
MCH: 29.1 pg (ref 26.0–34.0)
MCHC: 33.5 g/dL (ref 30.0–36.0)
MCV: 86.6 fL (ref 80.0–100.0)
Platelets: 331 10*3/uL (ref 150–400)
RBC: 5.61 MIL/uL (ref 4.22–5.81)
RDW: 13.2 % (ref 11.5–15.5)
WBC: 10.5 10*3/uL (ref 4.0–10.5)
nRBC: 0 % (ref 0.0–0.2)

## 2020-11-11 LAB — URINALYSIS, COMPLETE (UACMP) WITH MICROSCOPIC
Bacteria, UA: NONE SEEN
Bilirubin Urine: NEGATIVE
Glucose, UA: NEGATIVE mg/dL
Ketones, ur: NEGATIVE mg/dL
Leukocytes,Ua: NEGATIVE
Nitrite: NEGATIVE
Protein, ur: NEGATIVE mg/dL
Specific Gravity, Urine: 1.002 — ABNORMAL LOW (ref 1.005–1.030)
Squamous Epithelial / HPF: NONE SEEN (ref 0–5)
pH: 7 (ref 5.0–8.0)

## 2020-11-11 MED ORDER — OXYCODONE-ACETAMINOPHEN 5-325 MG PO TABS
1.0000 | ORAL_TABLET | Freq: Once | ORAL | Status: AC
  Administered 2020-11-11: 1 via ORAL
  Filled 2020-11-11: qty 1

## 2020-11-11 MED ORDER — METHYLPREDNISOLONE 4 MG PO TBPK: ORAL_TABLET | ORAL | 0 refills | Status: DC

## 2020-11-11 MED ORDER — BACLOFEN 10 MG PO TABS: 10.0000 mg | ORAL_TABLET | Freq: Three times a day (TID) | ORAL | 0 refills | Status: AC

## 2020-11-11 NOTE — ED Notes (Signed)
See triage note  Presents with right flank pain  States pain started last pm   States he tried moist heat and massage w/o relief  No n/v/ or fever  Denies any hx of renal stones

## 2020-11-11 NOTE — ED Provider Notes (Signed)
[facility] Emergency Department Provider Note  ____________________________________________   Event Date/Time   First MD Initiated Contact with Patient 11/11/20 1137     (approximate)  I have reviewed the triage vital signs and the nursing notes.   HISTORY  Chief Complaint Flank Pain    HPI Todd Black is a 50 y.o. male presents emergency department complaint of right-sided flank pain.  Patient states pain started last night.  No history of kidney stones.  His wife states he did have problems with his kidneys in the past which had decreased function.  They had to rearrange medications to correct this.  He has had no problem with his kidney since then.  They are concerned as blood pressure is elevated.  Also has some right lower quadrant pain.  No blood in the urine.  No fever or chills.  No vomiting or diarrhea.    Past Medical History:  Diagnosis Date  . Arthritis   . History of bloody stools   . History of dermoid cyst excision   . Hyperlipidemia   . Hypertension     Patient Active Problem List   Diagnosis Date Noted  . Viral syndrome 05/01/2019  . Hypercalcemia 10/18/2018  . Internal hemorrhoid 09/06/2018  . AKI (acute kidney injury) ([place]) 08/18/2018  . LVH (left ventricular hypertrophy) 08/15/2018  . NAFLD (nonalcoholic fatty liver disease) 08/11/2018  . HTN (hypertension) 08/11/2018  . Hyperlipidemia 08/11/2018    Past Surgical History:  Procedure Laterality Date  . DERMOID CYST  EXCISION    . LIVER BIOPSY    . RADICAL ORCHIECTOMY  1999  . SHOULDER ARTHROSCOPY Right 2005   Dr Judieth Keens in [place]    Prior to Admission medications   Medication Sig Start Date End Date Taking? Authorizing Provider  baclofen (LIORESAL) 10 MG tablet Take 1 tablet (10 mg total) by mouth 3 (three) times daily for 10 days. 11/11/20 11/21/20 Yes Zamariya Neal, Linden Dolin, PA-C  methylPREDNISolone (MEDROL DOSEPAK) 4 MG TBPK tablet Take 6 pills on day one then  decrease by 1 pill each day 11/11/20  Yes Jodell Weitman, Linden Dolin, PA-C  amLODipine (NORVASC) 10 MG tablet TAKE 1 TABLET(10 MG) BY MOUTH DAILY 05/28/20   Lesleigh Noe, MD  ibuprofen (ADVIL,MOTRIN) 200 MG tablet Take 200 mg by mouth every 6 (six) hours as needed.    [provider]  lisinopril (ZESTRIL) 40 MG tablet Take 1 tablet (40 mg total) by mouth daily. 05/28/20   Lesleigh Noe, MD    Allergies Bee venom  Family History  Problem Relation Age of Onset  . Arthritis Father   . Hyperlipidemia Father   . Hypertension Father   . Skin cancer Father        non-melanoma  . Crohn's disease Sister   . Supraventricular tachycardia Brother   . Heart attack Maternal Grandfather 59    Social History Social History   Tobacco Use  . Smoking status: Never Smoker  . Smokeless tobacco: Never Used  Vaping Use  . Vaping Use: Never used  Substance Use Topics  . Alcohol use: No  . Drug use: No    Review of Systems  Constitutional: No fever/chills Eyes: No visual changes. ENT: No sore throat. Respiratory: Denies cough Cardiovascular: Denies chest pain Gastrointestinal: Positive abdominal pain Genitourinary: Negative for dysuria. Musculoskeletal: Positive for back pain. Skin: Negative for rash. Psychiatric: no mood changes,     ____________________________________________   PHYSICAL EXAM:  VITAL SIGNS: ED Triage Vitals  Enc Vitals Group     BP 11/11/20 1052 (!) 159/107     Pulse Rate 11/11/20 1052 79     Resp 11/11/20 1052 18     Temp 11/11/20 1052 97.9 F (36.6 C)     Temp Source 11/11/20 1052 Oral     SpO2 --      Weight 11/11/20 1053 190 lb (86.2 kg)     Height 11/11/20 1053 5\' 7"  (1.702 m)     Head Circumference --      Peak Flow --      Pain Score 11/11/20 1053 8     Pain Loc --      Pain Edu? --      Excl. in [place]? --     Constitutional: Alert and oriented. Well appearing and in no acute distress. Eyes: Conjunctivae are normal.  Head: Atraumatic. Nose: No  congestion/rhinnorhea. Mouth/Throat: Mucous membranes are moist.   Neck:  supple no lymphadenopathy noted Cardiovascular: Normal rate, regular rhythm. Heart sounds are normal Respiratory: Normal respiratory effort.  No retractions, lungs c t a  Abd: soft tender in the right lower quadrant, bs normal all 4 quad GU: deferred Musculoskeletal: FROM all extremities, warm and well perfused Neurologic:  Normal speech and language.  Skin:  Skin is warm, dry and intact. No rash noted. Psychiatric: Mood and affect are normal. Speech and behavior are normal.  ____________________________________________   LABS (all labs ordered are listed, but only abnormal results are displayed)  Labs Reviewed  URINALYSIS, COMPLETE (UACMP) WITH MICROSCOPIC - Abnormal; Notable for the following components:      Result Value   Color, Urine COLORLESS (*)    APPearance CLEAR (*)    Specific Gravity, Urine 1.002 (*)    Hgb urine dipstick SMALL (*)    All other components within normal limits  BASIC METABOLIC PANEL  CBC   ____________________________________________   ____________________________________________  RADIOLOGY  CT renal stone study  ____________________________________________   PROCEDURES  Procedure(s) performed: No  Procedures    ____________________________________________   INITIAL IMPRESSION / ASSESSMENT AND PLAN / ED COURSE  Pertinent labs & imaging results that were available during my care of the patient were reviewed by me and considered in my medical decision making (see chart for details).   Patient is a 50 year old male presents with right-sided flank pain.  See HPI.  Physical exam shows patient appears stable but uncomfortable.  He does have pain with movement.  Some tenderness in the right lower quadrant.  DDx: Acut abdominal pain, kidney stone, acute appendicitis, musculoskeletal pain, pyelonephritis  CBC still pending as the system is down Basic metabolic panel  is normal, urinalysis has small amount of hemoglobin movement  CT renal stone study  Patient was given Percocet p.o. while here in the ED   CT renal stone study shows a simple cyst on the right kidney which is stable.  Degenerative changes at L1-L2 and L3-L4  Explained all findings to the patient.  Explained does not have acute appendicitis or kidney stone.  He does have musculoskeletal type pain.  He was given a prescription for Medrol Dosepak and baclofen.  Follow-up with his regular doctor if not better in 5 to 7 days.  Return emergency department worsening.  Apply wet heat followed by ice.  He was discharged in stable condition.  Todd Black was evaluated in Emergency Department on 11/11/2020 for the symptoms described in the history of present illness. He was evaluated in the context of the global  COVID-19 pandemic, which necessitated consideration that the patient might be at risk for infection with the SARS-CoV-2 virus that causes COVID-19. Institutional protocols and algorithms that pertain to the evaluation of patients at risk for COVID-19 are in a state of rapid change based on information released by regulatory bodies including the CDC and federal and state organizations. These policies and algorithms were followed during the patient's care in the ED.    As part of my medical decision making, I reviewed the following data within the [place] History obtained from family, Nursing notes reviewed and incorporated, Labs reviewed , Old chart reviewed, Radiograph reviewed , Notes from prior ED visits and [place] Controlled Substance Database  ____________________________________________   FINAL CLINICAL IMPRESSION(S) / ED DIAGNOSES  Final diagnoses:  Right flank pain      NEW MEDICATIONS STARTED DURING THIS VISIT:  New Prescriptions   BACLOFEN (LIORESAL) 10 MG TABLET    Take 1 tablet (10 mg total) by mouth 3 (three) times daily for 10 days.   METHYLPREDNISOLONE  (MEDROL DOSEPAK) 4 MG TBPK TABLET    Take 6 pills on day one then decrease by 1 pill each day     Note:  This document was prepared using Dragon voice recognition software and may include unintentional dictation errors.    Versie Starks, PA-C 11/11/20 1304    Lucrezia Starch, MD 11/11/20 [phone]

## 2020-11-11 NOTE — ED Triage Notes (Signed)
Pt c/o right flank pain since last night, denies N/v or a hx of kidney stones in the past

## 2021-05-29 ENCOUNTER — Other Ambulatory Visit: Payer: Self-pay | Admitting: Family Medicine

## 2021-05-29 DIAGNOSIS — I10 Essential (primary) hypertension: Secondary | ICD-10-CM

## 2021-05-29 NOTE — Telephone Encounter (Signed)
Please schedule CPE with Dr Einar Pheasant. Thank you

## 2021-05-29 NOTE — Telephone Encounter (Signed)
Reroute

## 2021-05-30 NOTE — Telephone Encounter (Signed)
lvmtcb

## 2021-06-02 NOTE — Telephone Encounter (Signed)
2nd attempt LMTCB to schedule

## 2021-06-23 ENCOUNTER — Ambulatory Visit: Payer: BC Managed Care – PPO | Admitting: Family Medicine

## 2021-06-23 ENCOUNTER — Other Ambulatory Visit: Payer: Self-pay

## 2021-06-23 VITALS — BP 138/92 | HR 86 | Temp 97.3°F | Ht 65.75 in | Wt 203.0 lb

## 2021-06-23 DIAGNOSIS — I10 Essential (primary) hypertension: Secondary | ICD-10-CM | POA: Diagnosis not present

## 2021-06-23 DIAGNOSIS — M25561 Pain in right knee: Secondary | ICD-10-CM

## 2021-06-23 DIAGNOSIS — Z23 Encounter for immunization: Secondary | ICD-10-CM | POA: Diagnosis not present

## 2021-06-23 DIAGNOSIS — M79661 Pain in right lower leg: Secondary | ICD-10-CM

## 2021-06-23 MED ORDER — LISINOPRIL 40 MG PO TABS: 40.0000 mg | ORAL_TABLET | Freq: Every day | ORAL | 3 refills | Status: DC

## 2021-06-23 MED ORDER — AMLODIPINE BESYLATE 10 MG PO TABS: ORAL_TABLET | ORAL | 3 refills | Status: DC

## 2021-06-23 NOTE — Patient Instructions (Signed)
#  Blood pressure  - check daily for the next 1 week - update next week  #Knee/hip/leg pain - imaging studies - back of the knee and the calf - may do physical therapy if imaging is reassuring

## 2021-06-23 NOTE — Assessment & Plan Note (Signed)
Possible baker's cyst - though posterior knee looks the same. Posterior ttp and no trauma. [place] to evaluate. If negative plan for PT referral

## 2021-06-23 NOTE — Progress Notes (Signed)
Subjective:     Todd Black is a 50 y.o. male presenting for Knee Pain (Burning behind R knee x 3-4 weeks on and off), Leg Pain (R. pain radiates from hip all the way down to calf x 3-4 weeks on and off/), and Medication Refill     HPI  #Knee pain - right knee - burning pain - like a hot iron on the back - worse with prolonged standing - symptoms 3-4 weeks ago - was playing tennis 3 days a week - and maybe 1 week prior he stepped out wide to get a shot and knee went far --- no pop, swelling or immediate pain - then started getting hip pain and thigh pain and now it is in the calf as well - pain is worse with walking - back cramping starts on the back and travels down - hip pain is crampy and sore - worse with walking - knee is burning - no instability - no back pain - no pain in the groin - symptoms in the shower in the mornings - elevating legs in the recliner or laying on his back relieves the pain   #HTN - taking amlodipine 10 mg and lisinopril 40 mg - does not check regularly - though was high today - typically 130s/80-90  Is worried about peripheral arterial disease  Review of Systems   Social History   Tobacco Use  Smoking Status Never  Smokeless Tobacco Never        Objective:    BP Readings from Last 3 Encounters:  06/23/21 (!) 138/92  11/11/20 (!) 150/99  05/28/20 122/90   Wt Readings from Last 3 Encounters:  06/23/21 203 lb (92.1 kg)  11/11/20 190 lb (86.2 kg)  05/28/20 197 lb 12 oz (89.7 kg)    BP (!) 138/92   Pulse 86   Temp (!) 97.3 F (36.3 C) (Temporal)   Ht 5' 5.75" (1.67 m)   Wt 203 lb (92.1 kg)   SpO2 98%   BMI 33.01 kg/m    Physical Exam Constitutional:      Appearance: Normal appearance. He is not ill-appearing or diaphoretic.  HENT:     Right Ear: External ear normal.     Left Ear: External ear normal.     Nose: Nose normal.  Eyes:     General: No scleral icterus.    Extraocular Movements: Extraocular  movements intact.     Conjunctiva/sclera: Conjunctivae normal.  Cardiovascular:     Rate and Rhythm: Normal rate and regular rhythm.  Pulmonary:     Effort: Pulmonary effort is normal. No respiratory distress.     Breath sounds: Normal breath sounds. No wheezing.  Musculoskeletal:     Cervical back: Neck supple.     Comments: Right knee:  Inspection: posterior knee w/o obvious swelling. No knee swelling. Right calf > left ROM: normal w/o pain Strength: normal Palpation: TTP along posterior knee, calf, lateral calf, and hamstring ttp  Hip No joint or lateral ttp No rom w/o pain   Skin:    General: Skin is warm and dry.  Neurological:     Mental Status: He is alert. Mental status is at baseline.  Psychiatric:        Mood and Affect: Mood normal.          Assessment & Plan:   Problem List Items Addressed This Visit       Cardiovascular and Mediastinum   Essential hypertension - Primary    BP  elevated. Home monitoring and update via mychart in 2 weeks. Cont lisinopril and amlodipine      Relevant Medications   amLODipine (NORVASC) 10 MG tablet   lisinopril (ZESTRIL) 40 MG tablet     Other   Right calf pain    Given duration low concern for clot, but given size difference will get [place] to rule out. Suspect muscle strain. Worse with ambulation and given HTN may consider ABI if [place] negative and no improvement. Discussed trial of PT first      Relevant Orders   US Venous Img Lower Unilateral Right (DVT)   Acute pain of right knee    Possible baker's cyst - though posterior knee looks the same. Posterior ttp and no trauma. [place] to evaluate. If negative plan for PT referral      Relevant Orders   US Venous Img Lower Unilateral Right (DVT)   Other Visit Diagnoses     Need for influenza vaccination       Relevant Orders   Flu Vaccine QUAD 58mo+IM (Fluarix, Fluzone & Alfiuria Quad PF) (Completed)        Return if symptoms worsen or fail to improve.  Lesleigh Noe,  MD  This visit occurred during the SARS-CoV-2 public health emergency.  Safety protocols were in place, including screening questions prior to the visit, additional usage of staff PPE, and extensive cleaning of exam room while observing appropriate contact time as indicated for disinfecting solutions.

## 2021-06-23 NOTE — Assessment & Plan Note (Signed)
BP elevated. Home monitoring and update via mychart in 2 weeks. Cont lisinopril and amlodipine

## 2021-06-23 NOTE — Assessment & Plan Note (Signed)
Given duration low concern for clot, but given size difference will get [place] to rule out. Suspect muscle strain. Worse with ambulation and given HTN may consider ABI if [place] negative and no improvement. Discussed trial of PT first

## 2021-06-24 ENCOUNTER — Telehealth: Payer: Self-pay | Admitting: Family Medicine

## 2021-06-24 ENCOUNTER — Ambulatory Visit
Admission: RE | Admit: 2021-06-24 | Discharge: 2021-06-24 | Disposition: A | Discharge status: Home / Self Care | Payer: BC Managed Care – PPO | Source: Ambulatory Visit | Attending: Family Medicine | Admitting: Family Medicine

## 2021-06-24 ENCOUNTER — Encounter: Payer: Self-pay | Admitting: Family Medicine

## 2021-06-24 DIAGNOSIS — M25561 Pain in right knee: Secondary | ICD-10-CM | POA: Insufficient documentation

## 2021-06-24 DIAGNOSIS — M79661 Pain in right lower leg: Secondary | ICD-10-CM | POA: Insufficient documentation

## 2021-06-24 DIAGNOSIS — S76319A Strain of muscle, fascia and tendon of the posterior muscle group at thigh level, unspecified thigh, initial encounter: Secondary | ICD-10-CM | POA: Insufficient documentation

## 2021-06-24 NOTE — Telephone Encounter (Signed)
Pt wife called in stating pt had a ultra sound and wanting to go over results with provider because the pt leg is in sever pain

## 2021-06-24 NOTE — Telephone Encounter (Signed)
Mychart message from wife was forwarded to Dr. Einar Pheasant with high priority.

## 2022-01-01 ENCOUNTER — Ambulatory Visit (INDEPENDENT_AMBULATORY_CARE_PROVIDER_SITE_OTHER): Payer: BC Managed Care – PPO | Admitting: Family Medicine

## 2022-01-01 ENCOUNTER — Encounter: Payer: Self-pay | Admitting: Family Medicine

## 2022-01-01 VITALS — BP 140/82 | HR 66 | Temp 97.8°F | Ht 66.0 in | Wt 202.0 lb

## 2022-01-01 DIAGNOSIS — Z0000 Encounter for general adult medical examination without abnormal findings: Secondary | ICD-10-CM

## 2022-01-01 DIAGNOSIS — I10 Essential (primary) hypertension: Secondary | ICD-10-CM | POA: Diagnosis not present

## 2022-01-01 DIAGNOSIS — Z1211 Encounter for screening for malignant neoplasm of colon: Secondary | ICD-10-CM | POA: Diagnosis not present

## 2022-01-01 DIAGNOSIS — E785 Hyperlipidemia, unspecified: Secondary | ICD-10-CM | POA: Diagnosis not present

## 2022-01-01 LAB — COMPREHENSIVE METABOLIC PANEL
ALT: 73 U/L — ABNORMAL HIGH (ref 0–53)
AST: 40 U/L — ABNORMAL HIGH (ref 0–37)
Albumin: 4.6 g/dL (ref 3.5–5.2)
Alkaline Phosphatase: 102 U/L (ref 39–117)
BUN: 11 mg/dL (ref 6–23)
CO2: 28 mEq/L (ref 19–32)
Calcium: 9.9 mg/dL (ref 8.4–10.5)
Chloride: 102 mEq/L (ref 96–112)
Creatinine, Ser: 1.1 mg/dL (ref 0.40–1.50)
GFR: 77.27 mL/min (ref >60.00)
Glucose, Bld: 84 mg/dL (ref 70–99)
Potassium: 4.4 mEq/L (ref 3.5–5.1)
Sodium: 136 mEq/L (ref 135–145)
Total Bilirubin: 0.8 mg/dL (ref 0.2–1.2)
Total Protein: 7.7 g/dL (ref 6.0–8.3)

## 2022-01-01 LAB — LIPID PANEL
Cholesterol: 190 mg/dL (ref 0–200)
HDL: 37.5 mg/dL — ABNORMAL LOW (ref >39.00)
LDL Cholesterol: 135 mg/dL — ABNORMAL HIGH (ref 0–99)
NonHDL: 152.7
Total CHOL/HDL Ratio: 5
Triglycerides: 90 mg/dL (ref 0.0–149.0)
VLDL: 18 mg/dL (ref 0.0–40.0)

## 2022-01-01 NOTE — Progress Notes (Signed)
Annual Exam  ? ?Chief Complaint:  ?Chief Complaint  ?Patient presents with  ? Annual Exam  ? Referral  ?  Dermatologist for spot on nose   ? ? ?History of Present Illness:  ?Todd Black is a 51 y.o. presents today for annual examination.   ? ? ?Nutrition/Lifestyle ?Diet: generally good ?Exercise: tennis three days a week ?He is single partner, contraception - status post hysterectomy.  ?Any issues with getting or keeping erection? No ? ?Social History  ? ?Tobacco Use  ?Smoking Status Never  ?Smokeless Tobacco Never  ? ?Social History  ? ?Substance and Sexual Activity  ?Alcohol Use No  ? ?Social History  ? ?Substance and Sexual Activity  ?Drug Use No  ? ? ? ?Safety ?The patient wears seatbelts: yes.     ?The patient feels safe at home and in their relationships: yes. ? ?General Health ?Dentist in the last year: No ?Eye doctor: yes ? ?Weight ?Wt Readings from Last 3 Encounters:  ?01/01/22 202 lb (91.6 kg)  ?06/23/21 203 lb (92.1 kg)  ?11/11/20 190 lb (86.2 kg)  ? ?Patient has high BMI  ?BMI Readings from Last 1 Encounters:  ?01/01/22 32.60 kg/m?  ? ? ? ?Chronic disease screening ?Blood pressure monitoring:  ?BP Readings from Last 3 Encounters:  ?01/01/22 140/82  ?06/23/21 (!) 138/92  ?11/11/20 (!) 150/99  ? ? ?Lipid Monitoring: Indication for screening: age >35, obesity, diabetes, family hx, CV risk factors.  ?Lipid screening: Yes ? ?Lab Results  ?Component Value Date  ? CHOL 181 05/28/2020  ? HDL 35.10 (L) 05/28/2020  ? LDLCALC 131 (H) 05/28/2020  ? TRIG 77.0 05/28/2020  ? CHOLHDL 5 05/28/2020  ? ? ? ?Diabetes Screening: age >42, overweight, family hx, PCOS, hx of gestational diabetes, at risk ethnicity, elevated blood pressure >135/80.  ?Diabetes Screening screening: Yes ? ?No results found for: HGBA1C ? ? ?Prostate Cancer Screening: Not Indicated ?Age 52-69 yo Shared Decision Making ?Higher Risk: Older age, African American, Family Hx of Prostate Cancer - No ?Benefits: screening may prevent 1.3 deaths from  prostate cancer over 13 years per 1000 men screened and prevent 3 metastatic cases per 1000 men screened. Not enough evidence to support more benefit for AA or [facility] ?Harms: False Positive and psychological harms. 15% of me with false positive over a 2 to 4 year period > resulting in biopsy and complications such as pain, hematospermia, infections. Overdiagnosis - increases with age - found that 20-50% of prostate cancer through screening may have never caused any issues. Harms of treatment include - erectile dysfunction, urinary incontinence, and bothersome bowel symptoms.  ? ?After discussion he does not want to get a PSA checked today.  ? ?Inadequate evidence for screening <55 ?No mortality benefit for screening >70  ? ?No results found for: PSA1, PSA ? ? ? ? ?Colon Cancer Screening:  ?Age 56-75 yo - benefits outweigh the risk. Adults 15-85 yo who have never been screened benefit.  ?Benefits: 134000 people in 2016 will be diagnosed and 49,000 will die - early detection helps ?Harms: Complications 2/2 to colonoscopy ?High Risk (Colonoscopy): genetic disorder (Lynch syndrome or familial adenomatous polyposis), personal hx of IBD, previous adenomatous polyp, or previous colorectal cancer, FamHx start 10 years before the age at diagnosis, increased in males and black race ? ?Options:  ?FIT - looks for hemoglobin (blood in the stool) - specific and fairly sensitive - must be done annually ?Cologuard - looks for DNA and blood - more sensitive - therefore can  have more false positives, every 3 years ?Colonoscopy - every 10 years if normal - sedation, bowl prep, must have someone drive you ? ?Shared decision making and the patient had decided to do colonoscopy. ? ? ?Social History  ? ?Tobacco Use  ?Smoking Status Never  ?Smokeless Tobacco Never  ? ? ?Lung Cancer Screening (Ages 91-47): not applicable ? ? ?Past Medical History:  ?Diagnosis Date  ? Arthritis   ? History of bloody stools   ? History of dermoid cyst excision    ? Hyperlipidemia   ? Hypertension   ? ? ?Past Surgical History:  ?Procedure Laterality Date  ? DERMOID CYST  EXCISION    ? LIVER BIOPSY    ? RADICAL ORCHIECTOMY  1999  ? SHOULDER ARTHROSCOPY Right 2005  ? Dr Judieth Keens in [place]  ? ? ?Prior to Admission medications   ?Medication Sig Start Date End Date Taking? Authorizing Provider  ?amLODipine (NORVASC) 10 MG tablet TAKE 1 TABLET(10 MG) BY MOUTH DAILY 06/23/21  Yes Lesleigh Noe, MD  ?ibuprofen (ADVIL,MOTRIN) 200 MG tablet Take 200 mg by mouth every 6 (six) hours as needed.   Yes [provider]  ?lisinopril (ZESTRIL) 40 MG tablet Take 1 tablet (40 mg total) by mouth daily. 06/23/21  Yes Lesleigh Noe, MD  ? ? ?Allergies  ?Allergen Reactions  ? Bee Venom   ? ? ? ?Social History  ? ?Socioeconomic History  ? Marital status: Married  ?  Spouse name: Santiago Glad  ? Number of children: 5  ? Years of education: Associates degree  ? Highest education level: Not on file  ?Occupational History  ? Not on file  ?Tobacco Use  ? Smoking status: Never  ? Smokeless tobacco: Never  ?Vaping Use  ? Vaping Use: Never used  ?Substance and Sexual Activity  ? Alcohol use: No  ? Drug use: No  ? Sexual activity: Yes  ?  Birth control/protection: Other-see comments  ?  Comment: wife with hx of cancer  ?Other Topics Concern  ? Not on file  ?Social History Narrative  ? Married to Santiago Glad  ? They have 5 children - 2 in college and 3 at home still   ? Enjoys: playing tennis  ? Exercise: Tennis when warm 2-3 times per week, winter - gets 15-20,000 steps per day  ? Diet: typical [place]  ? Stress: work at a [employer] is stressful - also travels  ? ?Social Determinants of Health  ? ?Financial Resource Strain: Not on file  ?Food Insecurity: Not on file  ?Transportation Needs: Not on file  ?Physical Activity: Not on file  ?Stress: Not on file  ?Social Connections: Not on file  ?Intimate Partner Violence: Not on file  ? ? ?Family History  ?Problem Relation Age of Onset  ? Arthritis  Father   ? Hyperlipidemia Father   ? Hypertension Father   ? Skin cancer Father   ?     non-melanoma  ? Crohn's disease Sister   ? Supraventricular tachycardia Brother   ? Heart attack Maternal Grandfather 60  ? ? ?Review of Systems  ?Constitutional:  Negative for chills and fever.  ?HENT:  Negative for congestion and sore throat.   ?Eyes:  Negative for blurred vision and double vision.  ?Respiratory:  Negative for shortness of breath.   ?Cardiovascular:  Negative for chest pain.  ?Gastrointestinal:  Negative for heartburn, nausea and vomiting.  ?Genitourinary: Negative.   ?Musculoskeletal: Negative.  Negative for myalgias.  ?Skin:  Negative  for rash.  ?Neurological:  Negative for dizziness and headaches.  ?Endo/Heme/Allergies:  Does not bruise/bleed easily.  ?Psychiatric/Behavioral:  Negative for depression. The patient is not nervous/anxious.    ? ?Physical Exam ?BP 140/82   Pulse 66   Temp 97.8 ?F (36.6 ?C) (Oral)   Ht '5\' 6"'$  (1.676 m)   Wt 202 lb (91.6 kg)   SpO2 99%   BMI 32.60 kg/m?   ? ?BP Readings from Last 3 Encounters:  ?01/01/22 140/82  ?06/23/21 (!) 138/92  ?11/11/20 (!) 150/99  ? ? ? ? ?Physical Exam ?Constitutional:   ?   General: He is not in acute distress. ?   Appearance: He is well-developed. He is not diaphoretic.  ?HENT:  ?   Head: Normocephalic and atraumatic.  ?   Right Ear: Tympanic membrane and ear canal normal.  ?   Left Ear: Tympanic membrane and ear canal normal.  ?   Nose: Nose normal.  ?   Mouth/Throat:  ?   Pharynx: Uvula midline.  ?Eyes:  ?   General: No scleral icterus. ?   Conjunctiva/sclera: Conjunctivae normal.  ?   Pupils: Pupils are equal, round, and reactive to light.  ?Cardiovascular:  ?   Rate and Rhythm: Normal rate and regular rhythm.  ?   Heart sounds: Normal heart sounds. No murmur heard. ?Pulmonary:  ?   Effort: Pulmonary effort is normal. No respiratory distress.  ?   Breath sounds: Normal breath sounds. No wheezing.  ?Abdominal:  ?   General: Bowel sounds are  normal. There is no distension.  ?   Palpations: Abdomen is soft. There is no mass.  ?   Tenderness: There is no abdominal tenderness. There is no guarding.  ?Musculoskeletal:     ?   General: Normal range of motion.

## 2022-01-01 NOTE — Patient Instructions (Signed)
Would recommend Shingles vaccine ?- pharmacy or nurse visit ? ? ?

## 2022-01-13 ENCOUNTER — Other Ambulatory Visit: Payer: Self-pay

## 2022-01-13 ENCOUNTER — Telehealth: Payer: Self-pay

## 2022-01-13 DIAGNOSIS — Z1211 Encounter for screening for malignant neoplasm of colon: Secondary | ICD-10-CM

## 2022-01-13 MED ORDER — NA SULFATE-K SULFATE-MG SULF 17.5-3.13-1.6 GM/177ML PO SOLN: 1.0000 | Freq: Once | ORAL | 0 refills | Status: AC

## 2022-01-13 NOTE — Telephone Encounter (Signed)
Gastroenterology Pre-Procedure Review ? ?Request Date: 02/23/22 ?Requesting Physician: Dr. Vicente Males ? ?PATIENT REVIEW QUESTIONS: The patient responded to the following health history questions as indicated:   ? ?1. Are you having any GI issues? no ?2. Do you have a personal history of Polyps? no ?3. Do you have a family history of Colon Cancer or Polyps? no ?4. Diabetes Mellitus? no ?5. Joint replacements in the past 12 months?no ?6. Major health problems in the past 3 months?no ?7. Any artificial heart valves, MVP, or defibrillator?no ?   ?MEDICATIONS & ALLERGIES:    ?Patient reports the following regarding taking any anticoagulation/antiplatelet therapy:   ?Plavix, Coumadin, Eliquis, Xarelto, Lovenox, Pradaxa, Brilinta, or Effient? no ?Aspirin? no ? ?Patient confirms/reports the following medications:  ?Current Outpatient Medications  ?Medication Sig Dispense Refill  ? amLODipine (NORVASC) 10 MG tablet TAKE 1 TABLET(10 MG) BY MOUTH DAILY 90 tablet 3  ? ibuprofen (ADVIL,MOTRIN) 200 MG tablet Take 200 mg by mouth every 6 (six) hours as needed.    ? lisinopril (ZESTRIL) 40 MG tablet Take 1 tablet (40 mg total) by mouth daily. 90 tablet 3  ? ?No current facility-administered medications for this visit.  ? ? ?Patient confirms/reports the following allergies:  ?Allergies  ?Allergen Reactions  ? Bee Venom   ? ? ?No orders of the defined types were placed in this encounter. ? ? ?AUTHORIZATION INFORMATION ?Primary Insurance: ?1D#: ?Group #: ? ?Secondary Insurance: ?1D#: ?Group #: ? ?SCHEDULE INFORMATION: ?Date: 06/19 ?Time: ?Location: [facility] ?

## 2022-02-23 ENCOUNTER — Encounter: Payer: Self-pay | Admitting: Gastroenterology

## 2022-02-23 ENCOUNTER — Ambulatory Visit: Payer: BC Managed Care – PPO | Admitting: Certified Registered Nurse Anesthetist

## 2022-02-23 ENCOUNTER — Ambulatory Visit
Admission: RE | Admit: 2022-02-23 | Discharge: 2022-02-23 | Disposition: A | Discharge status: Home / Self Care | Payer: BC Managed Care – PPO | Attending: Gastroenterology | Admitting: Gastroenterology

## 2022-02-23 ENCOUNTER — Encounter
Admission: RE | Disposition: A | Discharge status: Home / Self Care | Payer: Self-pay | Source: Home / Self Care | Attending: Gastroenterology

## 2022-02-23 DIAGNOSIS — E785 Hyperlipidemia, unspecified: Secondary | ICD-10-CM | POA: Diagnosis not present

## 2022-02-23 DIAGNOSIS — Z1211 Encounter for screening for malignant neoplasm of colon: Secondary | ICD-10-CM | POA: Insufficient documentation

## 2022-02-23 DIAGNOSIS — I10 Essential (primary) hypertension: Secondary | ICD-10-CM | POA: Diagnosis not present

## 2022-02-23 DIAGNOSIS — Z79899 Other long term (current) drug therapy: Secondary | ICD-10-CM | POA: Insufficient documentation

## 2022-02-23 DIAGNOSIS — M1990 Unspecified osteoarthritis, unspecified site: Secondary | ICD-10-CM | POA: Diagnosis not present

## 2022-02-23 DIAGNOSIS — K621 Rectal polyp: Secondary | ICD-10-CM | POA: Insufficient documentation

## 2022-02-23 DIAGNOSIS — K5730 Diverticulosis of large intestine without perforation or abscess without bleeding: Secondary | ICD-10-CM | POA: Insufficient documentation

## 2022-02-23 DIAGNOSIS — D126 Benign neoplasm of colon, unspecified: Secondary | ICD-10-CM

## 2022-02-23 HISTORY — PX: COLONOSCOPY WITH PROPOFOL: SHX5780

## 2022-02-23 SURGERY — COLONOSCOPY WITH PROPOFOL
Anesthesia: General

## 2022-02-23 MED ORDER — PROPOFOL 500 MG/50ML IV EMUL
INTRAVENOUS | Status: DC | PRN
  Administered 2022-02-23: 150 ug/kg/min via INTRAVENOUS

## 2022-02-23 MED ORDER — GLYCOPYRROLATE 0.2 MG/ML IJ SOLN
INTRAMUSCULAR | Status: AC
  Filled 2022-02-23: qty 1

## 2022-02-23 MED ORDER — PROPOFOL 10 MG/ML IV BOLUS
INTRAVENOUS | Status: DC | PRN
  Administered 2022-02-23: 70 mg via INTRAVENOUS

## 2022-02-23 MED ORDER — PROPOFOL 500 MG/50ML IV EMUL
INTRAVENOUS | Status: AC
  Filled 2022-02-23: qty 50

## 2022-02-23 MED ORDER — LIDOCAINE HCL (CARDIAC) PF 100 MG/5ML IV SOSY
PREFILLED_SYRINGE | INTRAVENOUS | Status: DC | PRN
  Administered 2022-02-23: 50 mg via INTRAVENOUS

## 2022-02-23 MED ORDER — SODIUM CHLORIDE 0.9 % IV SOLN
INTRAVENOUS | Status: DC
  Administered 2022-02-23: 1000 mL via INTRAVENOUS

## 2022-02-23 MED ORDER — GLYCOPYRROLATE 0.2 MG/ML IJ SOLN
INTRAMUSCULAR | Status: DC | PRN
  Administered 2022-02-23: .2 mg via INTRAVENOUS

## 2022-02-23 MED ORDER — LIDOCAINE HCL (PF) 2 % IJ SOLN
INTRAMUSCULAR | Status: AC
  Filled 2022-02-23: qty 5

## 2022-02-23 NOTE — Anesthesia Preprocedure Evaluation (Signed)
Anesthesia Evaluation  Patient identified by MRN, date of birth, ID band Patient awake    Reviewed: Allergy & Precautions, H&P , NPO status , Patient's Chart, lab work & pertinent test results, reviewed documented beta blocker date and time   Airway Mallampati: II   Neck ROM: full    Dental  (+) Poor Dentition   Pulmonary neg pulmonary ROS,    Pulmonary exam normal        Cardiovascular Exercise Tolerance: Good hypertension, On Medications negative cardio ROS Normal cardiovascular exam Rhythm:regular Rate:Normal     Neuro/Psych negative neurological ROS  negative psych ROS   GI/Hepatic negative GI ROS, Neg liver ROS,   Endo/Other  negative endocrine ROS  Renal/GU Renal disease  negative genitourinary   Musculoskeletal   Abdominal   Peds  Hematology negative hematology ROS (+)   Anesthesia Other Findings Past Medical History: No date: Arthritis No date: History of bloody stools No date: History of dermoid cyst excision No date: Hyperlipidemia No date: Hypertension Past Surgical History: No date: DERMOID CYST  EXCISION No date: LIVER BIOPSY 1999: RADICAL ORCHIECTOMY 2005: SHOULDER ARTHROSCOPY; Right     Comment:  Dr Judieth Keens in [facility] BMI    Body Mass Index: 30.13 kg/m     Reproductive/Obstetrics negative OB ROS                             Anesthesia Physical Anesthesia Plan  ASA: 2  Anesthesia Plan: General   Post-op Pain Management:    Induction:   PONV Risk Score and Plan:   Airway Management Planned:   Additional Equipment:   Intra-op Plan:   Post-operative Plan:   Informed Consent: I have reviewed the patients History and Physical, chart, labs and discussed the procedure including the risks, benefits and alternatives for the proposed anesthesia with the patient or authorized representative who has indicated his/her understanding and acceptance.      Dental Advisory Given  Plan Discussed with: CRNA  Anesthesia Plan Comments:         Anesthesia Quick Evaluation

## 2022-02-23 NOTE — Anesthesia Procedure Notes (Signed)
Procedure Name: MAC Date/Time: 02/23/2022 7:34 AM  Performed by: Tollie Eth, CRNAPre-anesthesia Checklist: Patient identified, Emergency Drugs available, Suction available and Patient being monitored Patient Re-evaluated:Patient Re-evaluated prior to induction Oxygen Delivery Method: Nasal cannula Induction Type: IV induction Placement Confirmation: positive ETCO2

## 2022-02-23 NOTE — Transfer of Care (Signed)
Immediate Anesthesia Transfer of Care Note  Patient: Todd Black  Procedure(s) Performed: COLONOSCOPY WITH PROPOFOL  Patient Location: Endoscopy Unit  Anesthesia Type:General  Level of Consciousness: drowsy  Airway & Oxygen Therapy: Patient Spontanous Breathing  Post-op Assessment: Report given to RN and Post -op Vital signs reviewed and stable  Post vital signs: Reviewed and stable  Last Vitals:  Vitals Value Taken Time  BP 105/75 02/23/22 0810  Temp 35.7 C 02/23/22 0809  Pulse 77 02/23/22 0810  Resp 19 02/23/22 0810  SpO2 96 % 02/23/22 0810  Vitals shown include unvalidated device data.  Last Pain:  Vitals:   02/23/22 0809  TempSrc: Temporal  PainSc: Asleep         Complications: No notable events documented.

## 2022-02-23 NOTE — Anesthesia Postprocedure Evaluation (Signed)
Anesthesia Post Note  Patient: Todd Black  Procedure(s) Performed: COLONOSCOPY WITH PROPOFOL  Patient location during evaluation: PACU Anesthesia Type: General Level of consciousness: awake and alert Pain management: pain level controlled Vital Signs Assessment: post-procedure vital signs reviewed and stable Respiratory status: spontaneous breathing, nonlabored ventilation, respiratory function stable and patient connected to nasal cannula oxygen Cardiovascular status: blood pressure returned to baseline and stable Postop Assessment: no apparent nausea or vomiting Anesthetic complications: no   No notable events documented.   Last Vitals:  Vitals:   02/23/22 0704 02/23/22 0809  BP: (!) 130/92 105/75  Pulse: 67   Resp: 18   Temp: (!) 36.1 C (!) 35.7 C  SpO2: 100%     Last Pain:  Vitals:   02/23/22 0829  TempSrc:   PainSc: 0-No pain                 Molli Barrows

## 2022-02-23 NOTE — Op Note (Addendum)
[facility] Patient Name: Todd Black Procedure Date: 02/23/2022 6:49 AM MRN: [id number] Account #: [id number] Date of Birth: 05/29/1971 Admit Type: Outpatient Age: 51 Room: [facility] ENDO ROOM 1 Gender: Male Note Status: Finalized Instrument Name: Park Meo [id number] Procedure:             Colonoscopy Indications:           Screening for colorectal malignant neoplasm Providers:             Jonathon Bellows MD, MD Referring MD:          Jobe Marker. Einar Pheasant (Referring MD) Medicines:             Monitored Anesthesia Care Complications:         No immediate complications. Procedure:             Pre-Anesthesia Assessment:                        - Prior to the procedure, a History and Physical was                         performed, and patient medications, allergies and                         sensitivities were reviewed. The patient's tolerance                         of previous anesthesia was reviewed.                        - The risks and benefits of the procedure and the                         sedation options and risks were discussed with the                         patient. All questions were answered and informed                         consent was obtained.                        - ASA Grade Assessment: II - A patient with mild                         systemic disease.                        After obtaining informed consent, the colonoscope was                         passed under direct vision. Throughout the procedure,                         the patient's blood pressure, pulse, and oxygen                         saturations were monitored continuously. The                         Colonoscope was introduced  through the anus and                         advanced to the the cecum, identified by the                         appendiceal orifice. The colonoscopy was performed                         with ease. The patient tolerated the procedure well.                          The quality of the bowel preparation was excellent. Findings:      The perianal and digital rectal examinations were normal.      Multiple small-mouthed diverticula were found in the sigmoid colon.      A 5 mm polyp was found in the rectum. The polyp was sessile. The polyp       was removed with a cold snare. Resection and retrieval were complete.      The exam was otherwise without abnormality on direct and retroflexion       views. Impression:            - Diverticulosis in the sigmoid colon.                        - One 5 mm polyp in the rectum, removed with a cold                         snare. Resected and retrieved.                        - The examination was otherwise normal on direct and                         retroflexion views. Recommendation:        - Discharge patient to home (with escort).                        - Resume previous diet.                        - Continue present medications.                        - Await pathology results.                        - Repeat colonoscopy for surveillance based on                         pathology results. Procedure Code(s):     --- Professional ---                        [id number], Colonoscopy, flexible; with removal of                         tumor(s), polyp(s), or other lesion(s) by snare  technique Diagnosis Code(s):     --- Professional ---                        Z12.11, Encounter for screening for malignant neoplasm                         of colon                        K62.1, Rectal polyp                        K57.30, Diverticulosis of large intestine without                         perforation or abscess without bleeding CPT copyright 2019 American Medical Association. All rights reserved. The codes documented in this report are preliminary and upon coder review may  be revised to meet current compliance requirements. Jonathon Bellows, MD Jonathon Bellows MD, MD 02/23/2022 8:08:22  AM This report has been signed electronically. Number of Addenda: 0 Note Initiated On: 02/23/2022 6:49 AM Scope Withdrawal Time: 0 hours 6 minutes 58 seconds  Total Procedure Duration: 0 hours 11 minutes 31 seconds  Estimated Blood Loss:  Estimated blood loss: none.      [facility]

## 2022-02-23 NOTE — H&P (Signed)
Jonathon Bellows, MD [address] [place], [place], [place], [address] [address], [place], [place], [place], [address] Phone: [phone]  Fax: [phone]  Primary Care Physician:  Lesleigh Noe, MD   Pre-Procedure History & Physical: HPI:  Todd Black is a 51 y.o. male is here for an colonoscopy.   Past Medical History:  Diagnosis Date   Arthritis    History of bloody stools    History of dermoid cyst excision    Hyperlipidemia    Hypertension     Past Surgical History:  Procedure Laterality Date   DERMOID CYST  EXCISION     LIVER BIOPSY     RADICAL ORCHIECTOMY  1999   SHOULDER ARTHROSCOPY Right 2005   Dr Judieth Keens in [place]    Prior to Admission medications   Medication Sig Start Date End Date Taking? Authorizing Provider  amLODipine (NORVASC) 10 MG tablet TAKE 1 TABLET(10 MG) BY MOUTH DAILY 06/23/21  Yes Lesleigh Noe, MD  lisinopril (ZESTRIL) 40 MG tablet Take 1 tablet (40 mg total) by mouth daily. 06/23/21  Yes Lesleigh Noe, MD  ibuprofen (ADVIL,MOTRIN) 200 MG tablet Take 200 mg by mouth every 6 (six) hours as needed.    [provider]    Allergies as of 01/13/2022 - Review Complete 01/13/2022  Allergen Reaction Noted   Bee venom  05/13/2017    Family History  Problem Relation Age of Onset   Arthritis Father    Hyperlipidemia Father    Hypertension Father    Skin cancer Father        non-melanoma   Crohn's disease Sister    Supraventricular tachycardia Brother    Heart attack Maternal Grandfather 42    Social History   Socioeconomic History   Marital status: Married    Spouse name: Santiago Glad   Number of children: Not on file   Years of education: Associates degree   Highest education level: Not on file  Occupational History   Not on file  Tobacco Use   Smoking status: Never   Smokeless tobacco: Never  Vaping Use   Vaping Use: Never used  Substance and Sexual Activity   Alcohol use: No   Drug use: No    Sexual activity: Yes    Birth control/protection: Other-see comments    Comment: wife with hx of cancer  Other Topics Concern   Not on file  Social History Narrative   Married to Santiago Glad   They have 5 children - 2 in college and 3 at home still    Enjoys: playing tennis   Exercise: Tennis when warm 2-3 times per week, winter - gets 15-20,000 steps per day   Diet: typical [place]: work at a [employer] is stressful - also travels   Scientist, 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/11/2018)   Overall Financial Resource Strain (CARDIA)    Difficulty of Paying Living Expenses: Not hard at all  Food Insecurity: Not on file  Transportation Needs: Not on file  Physical Activity: Not on file  Stress: Not on file  Social Connections: Not on file  Intimate Partner Violence: Not on file    Review of Systems: See HPI, otherwise negative ROS  Physical Exam: BP (!) 130/92   Pulse 67   Temp (!) 96.9 F (36.1 C) (Temporal)   Resp 18   Ht '5\' 7"'$  (1.702 m)   Wt 87.3 kg   SpO2  100%   BMI 30.13 kg/m  General:   Alert,  pleasant and cooperative in NAD Head:  Normocephalic and atraumatic. Neck:  Supple; no masses or thyromegaly. Lungs:  Clear throughout to auscultation, normal respiratory effort.    Heart:  +S1, +S2, Regular rate and rhythm, No edema. Abdomen:  Soft, nontender and nondistended. Normal bowel sounds, without guarding, and without rebound.   Neurologic:  Alert and  oriented x4;  grossly normal neurologically.  Impression/Plan: Todd Black is here for an colonoscopy to be performed for Screening colonoscopy average risk   Risks, benefits, limitations, and alternatives regarding  colonoscopy have been reviewed with the patient.  Questions have been answered.  All parties agreeable.   Jonathon Bellows, MD  02/23/2022, 7:50 AM

## 2022-02-24 ENCOUNTER — Encounter: Payer: Self-pay | Admitting: Gastroenterology

## 2022-02-25 LAB — SURGICAL PATHOLOGY

## 2022-02-26 ENCOUNTER — Encounter: Payer: Self-pay | Admitting: Gastroenterology

## 2022-04-01 IMAGING — CT CT RENAL STONE PROTOCOL
2 of 4 series · 15 of 46 positions shown, 17 images · non-contrast
Comparison: 11/14/2018.

CLINICAL DATA: 49-year-old presenting with acute onset of RIGHT
flank pain that began last night. No personal history of urinary
tract calculi.

EXAM:
CT ABDOMEN AND PELVIS WITHOUT CONTRAST
TECHNIQUE: Multidetector CT imaging of the abdomen and pelvis was performed
following the standard protocol without IV contrast.

[Series 2: stone full standard · axial · 0.75mm/px · z∈[-526,-46]mm · 12 of 106 slices shown, 14 images]
[im 5/106  soft-tissue]
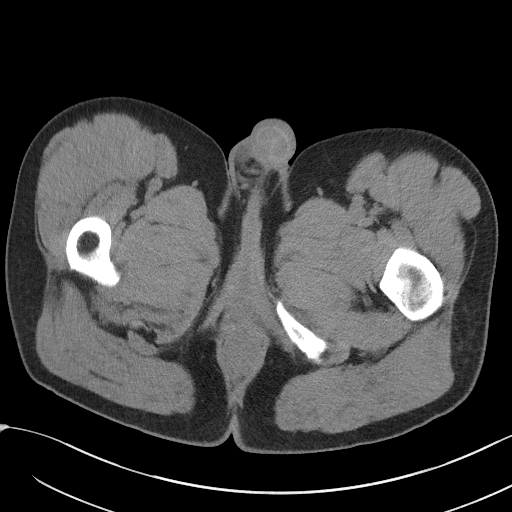
[im 5/106  bone]
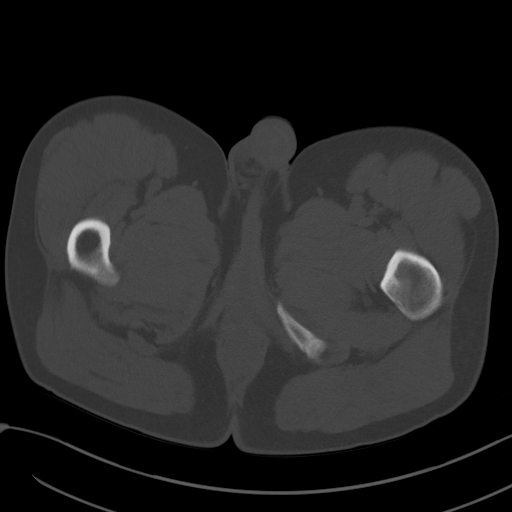
[im 14/106  soft-tissue]
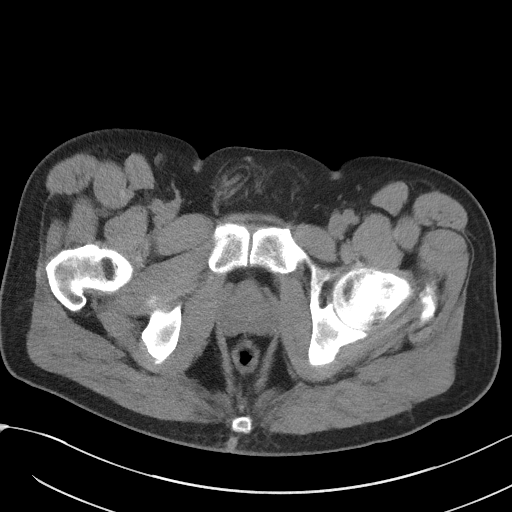
[im 22/106  soft-tissue]
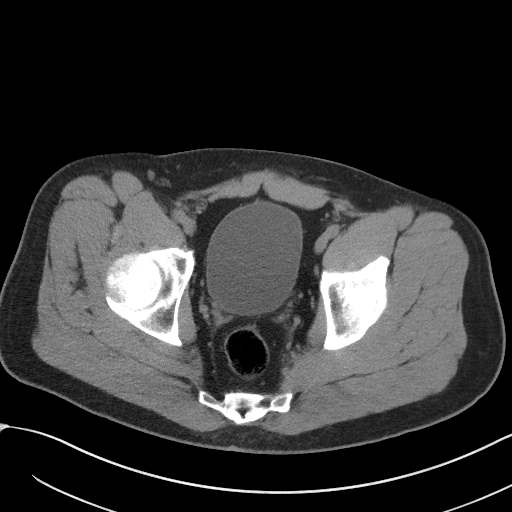
[im 31/106  soft-tissue]
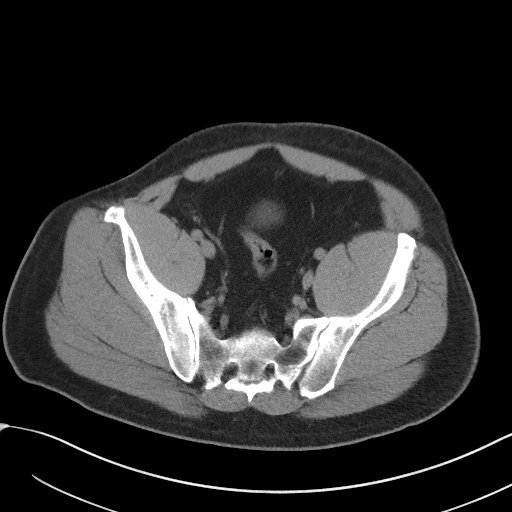
[im 40/106  soft-tissue]
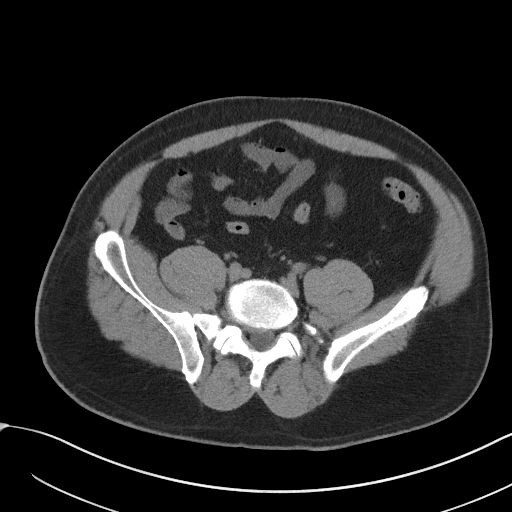
[im 49/106  soft-tissue]
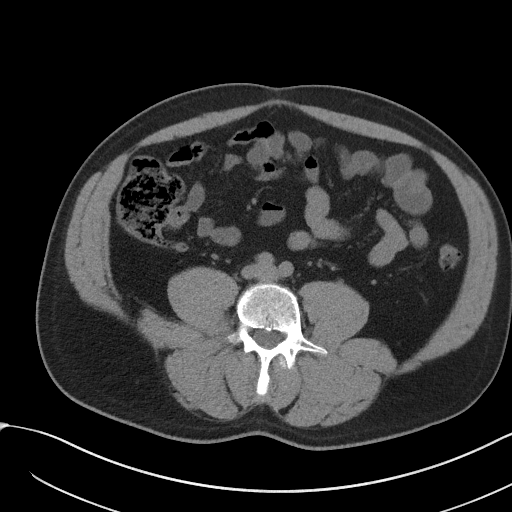
[im 57/106  soft-tissue]
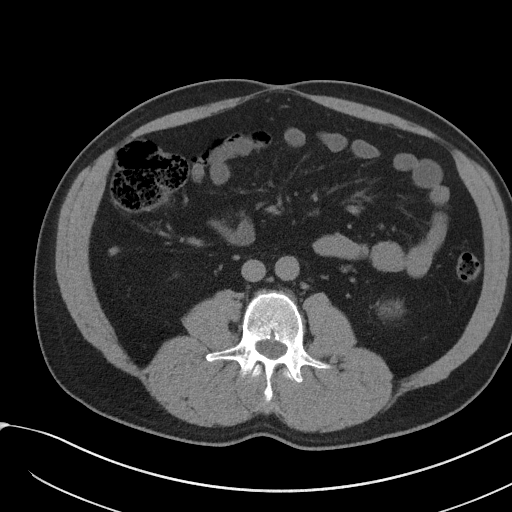
[im 66/106  soft-tissue]
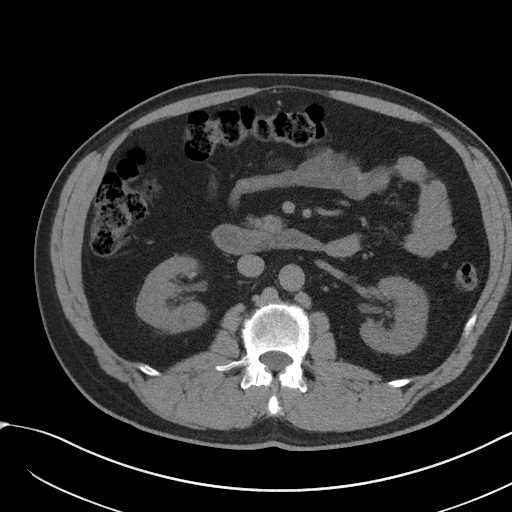
[im 75/106  soft-tissue]
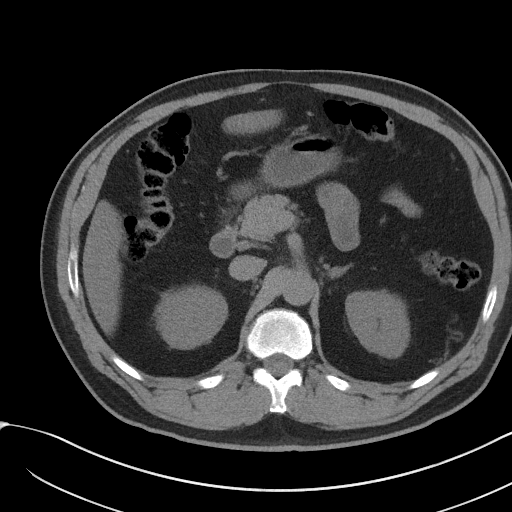
[im 75/106  bone]
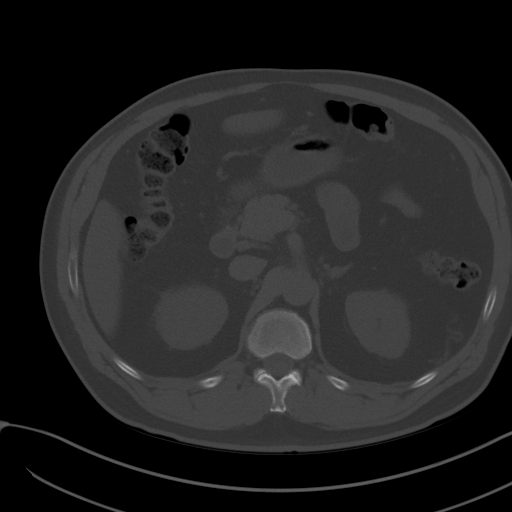
[im 84/106  soft-tissue]
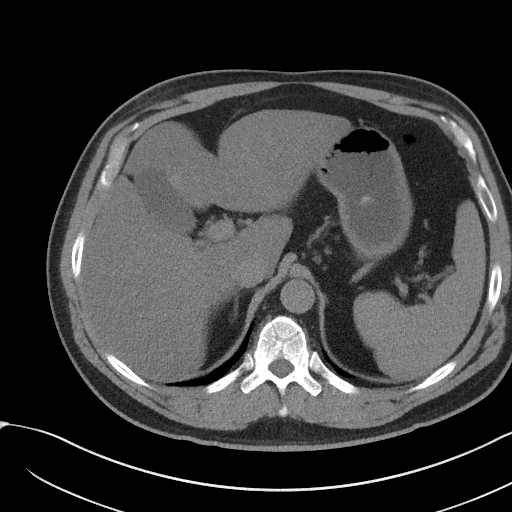
[im 92/106  soft-tissue]
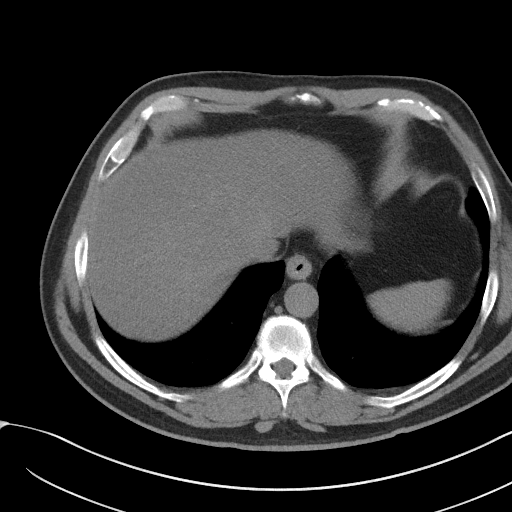
[im 101/106  soft-tissue]
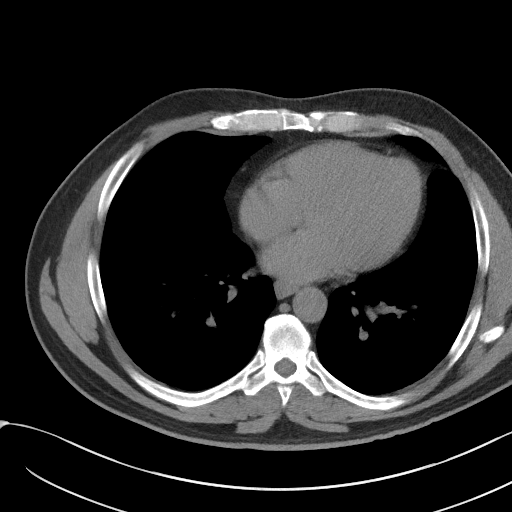

[Series 5: coronal · coronal · 0.82mm/px · 3 of 150 slices shown]
[im 50/150  soft-tissue]
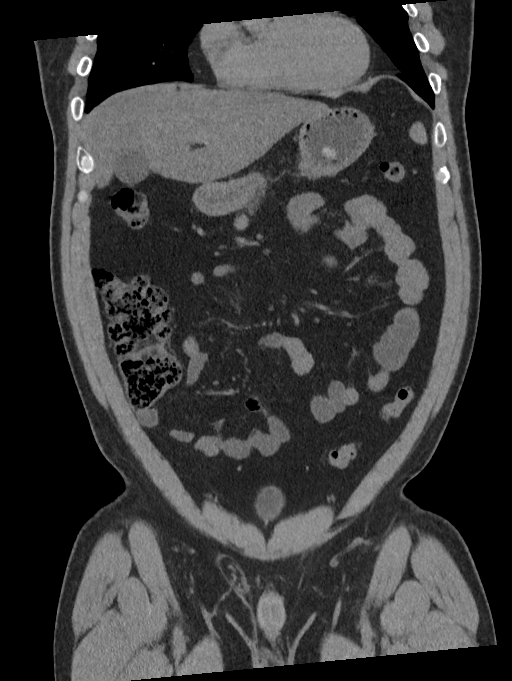
[im 67/150  soft-tissue]
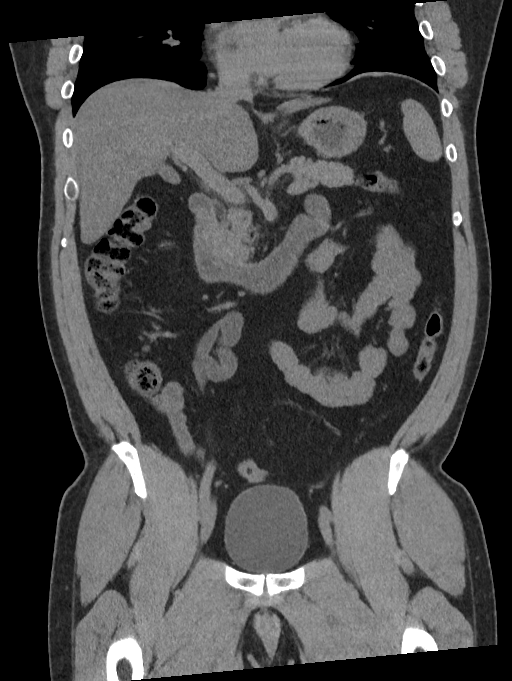
[im 83/150  soft-tissue]
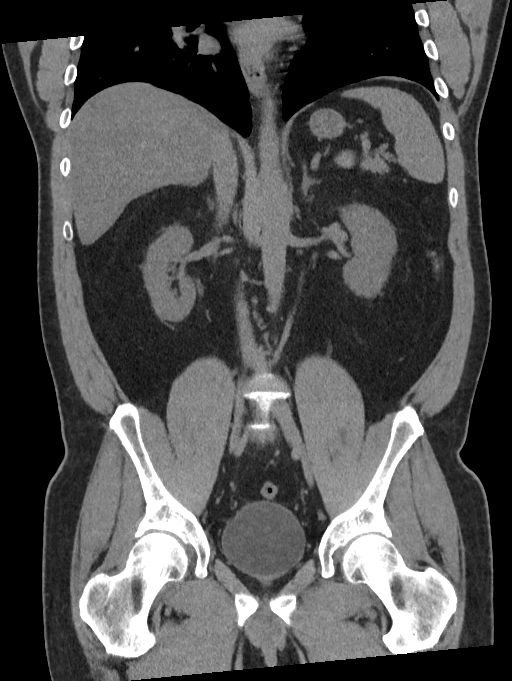

[15 of 46 positions shown; findings below may reference images not displayed]

FINDINGS: Lower chest: Heart size normal.  Visualized lung bases clear.

Hepatobiliary: Diffuse hepatic steatosis with focal sparing
surrounding the gallbladder. Allowing for the unenhanced technique,
no focal parenchymal abnormality involving the liver. Gallbladder
normal in appearance without calcified gallstones. No biliary ductal
dilation.

Pancreas: Normal in appearance without evidence of mass, ductal
dilation, or inflammation.

Spleen: Normal in size and appearance. Accessory splenule medial to
the lower pole of the spleen as noted previously.

Adrenals/Urinary Tract: Normal appearing adrenal glands. Approximate
2.0 cm simple cyst arising from the mid RIGHT kidney, unchanged.
Allowing for the unenhanced technique, no focal parenchymal
abnormality involving either kidney. No evidence of urinary tract
calculi. No hydronephrosis involving either kidney. Normal appearing
urinary bladder.

Stomach/Bowel: Stomach normal in appearance for the degree of
distention. Normal-appearing small bowel. Expected colonic stool
burden throughout normal appearing colon. Normal appearing
retrocecal appendix in the RIGHT mid abdomen.

Vascular/Lymphatic: No visible aortoiliofemoral atherosclerosis. No
pathologic lymphadenopathy.

Reproductive: Prostate gland normal in size, containing
calcifications. Normal seminal vesicles.

Other: Small RIGHT inguinal hernia containing indurated fat.

Musculoskeletal: Mild degenerative disc disease and spondylosis at
L1-2 and L2-3. Mild degenerative changes involving the visualized
thoracic spine. No acute findings
IMPRESSION: 1. No acute abnormalities involving the abdomen or pelvis.
2. Diffuse hepatic steatosis with focal sparing surrounding the
gallbladder.
3. Small RIGHT inguinal hernia containing indurated fat.

## 2022-06-05 ENCOUNTER — Ambulatory Visit: Payer: BC Managed Care – PPO | Admitting: Family Medicine

## 2022-06-05 VITALS — BP 120/82 | HR 72 | Temp 98.1°F | Wt 200.5 lb

## 2022-06-05 DIAGNOSIS — I10 Essential (primary) hypertension: Secondary | ICD-10-CM

## 2022-06-05 DIAGNOSIS — Z23 Encounter for immunization: Secondary | ICD-10-CM

## 2022-06-05 DIAGNOSIS — K760 Fatty (change of) liver, not elsewhere classified: Secondary | ICD-10-CM

## 2022-06-05 DIAGNOSIS — E785 Hyperlipidemia, unspecified: Secondary | ICD-10-CM

## 2022-06-05 MED ORDER — LISINOPRIL 40 MG PO TABS: 40.0000 mg | ORAL_TABLET | Freq: Every day | ORAL | 2 refills | Status: DC

## 2022-06-05 MED ORDER — AMLODIPINE BESYLATE 10 MG PO TABS: ORAL_TABLET | ORAL | 2 refills | Status: DC

## 2022-06-05 NOTE — Assessment & Plan Note (Signed)
Stable. Cont amlodipine '10mg'$  and lisinopril 40 mg.

## 2022-06-05 NOTE — Patient Instructions (Addendum)
Continue to work on healthy diet  - try to do a lean protein   Consider scheduling with  Eugenia Pancoast, NP or Dr. Diona Browner

## 2022-06-05 NOTE — Assessment & Plan Note (Signed)
Slightly worse, discussed healthy diet - lean protein. Repeat labs at next annual.

## 2022-06-05 NOTE — Assessment & Plan Note (Signed)
ASCVD 5.5%. Continue healthy diet and regular tennis.

## 2022-06-05 NOTE — Progress Notes (Signed)
   Subjective:     Todd Black is a 51 y.o. male presenting for Medication Refill     HPI  #HTN - taking medication - no issues - no cp, dizziness, ha, vision changes  #NAFLD - recent LFTs were elevated - not great, ok overall  - wife tries to get him to eat fruits/veggies - eats variety of meats - eating out less   Review of Systems   Social History   Tobacco Use  Smoking Status Never  Smokeless Tobacco Never        Objective:    BP Readings from Last 3 Encounters:  06/05/22 120/82  02/23/22 (!) 128/92  01/01/22 140/82   Wt Readings from Last 3 Encounters:  06/05/22 200 lb 8 oz (90.9 kg)  02/23/22 192 lb 6.3 oz (87.3 kg)  01/01/22 202 lb (91.6 kg)    BP 120/82   Pulse 72   Temp 98.1 F (36.7 C) (Temporal)   Wt 200 lb 8 oz (90.9 kg)   SpO2 100%   BMI 31.40 kg/m    Physical Exam Constitutional:      Appearance: Normal appearance. He is not ill-appearing or diaphoretic.  HENT:     Right Ear: External ear normal.     Left Ear: External ear normal.     Nose: Nose normal.  Eyes:     General: No scleral icterus.    Extraocular Movements: Extraocular movements intact.     Conjunctiva/sclera: Conjunctivae normal.  Cardiovascular:     Rate and Rhythm: Normal rate and regular rhythm.     Heart sounds: No murmur heard. Pulmonary:     Effort: Pulmonary effort is normal. No respiratory distress.     Breath sounds: Normal breath sounds. No wheezing.  Musculoskeletal:     Cervical back: Neck supple.  Skin:    General: Skin is warm and dry.  Neurological:     Mental Status: He is alert. Mental status is at baseline.  Psychiatric:        Mood and Affect: Mood normal.           Assessment & Plan:   Problem List Items Addressed This Visit       Cardiovascular and Mediastinum   Essential hypertension - Primary    Stable. Cont amlodipine '10mg'$  and lisinopril 40 mg.       Relevant Medications   amLODipine (NORVASC) 10 MG tablet    lisinopril (ZESTRIL) 40 MG tablet     Digestive   NAFLD (nonalcoholic fatty liver disease)    Slightly worse, discussed healthy diet - lean protein. Repeat labs at next annual.         Other   Hyperlipidemia    ASCVD 5.5%. Continue healthy diet and regular tennis.        Relevant Medications   amLODipine (NORVASC) 10 MG tablet   lisinopril (ZESTRIL) 40 MG tablet   Other Visit Diagnoses     Need for influenza vaccination       Relevant Orders   Flu Vaccine QUAD 37moIM (Fluarix, Fluzone & Alfiuria Quad PF) (Completed)        Return in about 7 months (around 01/04/2023) for TOC .  JLesleigh Noe MD

## 2022-07-27 ENCOUNTER — Telehealth: Payer: Self-pay | Admitting: Family Medicine

## 2022-07-27 NOTE — Telephone Encounter (Signed)
Please confirm that the pharmacy has the scripts that Dr. Einar Pheasant sent in on 9.29.23 that show 90 tablets with two refills.  There may be some confusion based on an older refill?  Informed the patient to also confirm with [facility] that they have the most current refill

## 2022-07-27 NOTE — Telephone Encounter (Signed)
Pt's wife, Santiago Glad called & stated the pt's meds, amLODipine (NORVASC) 10 MG tablet  Wasn't refilled for pt. Santiago Glad stated pt came in on 06/05/22 to see Dr. Einar Pheasant before she left just so his meds could continued to be refilled throughout the following year. Santiago Glad wants to know why meds wasn't refilled for the next year? Call back # [phone]

## 2022-07-28 NOTE — Telephone Encounter (Signed)
Called patient was able to pick up yesterday. No further action needed at this time.

## 2022-12-09 ENCOUNTER — Encounter: Payer: Self-pay | Admitting: Nurse Practitioner

## 2022-12-09 ENCOUNTER — Ambulatory Visit: Payer: BC Managed Care – PPO | Admitting: Nurse Practitioner

## 2022-12-09 VITALS — BP 132/82 | HR 77 | Temp 98.5°F | Ht 67.0 in | Wt 204.2 lb

## 2022-12-09 DIAGNOSIS — K760 Fatty (change of) liver, not elsewhere classified: Secondary | ICD-10-CM

## 2022-12-09 DIAGNOSIS — E785 Hyperlipidemia, unspecified: Secondary | ICD-10-CM | POA: Diagnosis not present

## 2022-12-09 DIAGNOSIS — I10 Essential (primary) hypertension: Secondary | ICD-10-CM | POA: Diagnosis not present

## 2022-12-09 MED ORDER — LISINOPRIL 40 MG PO TABS: 40.0000 mg | ORAL_TABLET | Freq: Every day | ORAL | 3 refills | Status: DC

## 2022-12-09 MED ORDER — AMLODIPINE BESYLATE 10 MG PO TABS: ORAL_TABLET | ORAL | 3 refills | Status: DC

## 2022-12-09 NOTE — Assessment & Plan Note (Deleted)
Resolved. Patient was evaluated by Ortho and completed physical therapy. He reports no issues or pain since.

## 2022-12-09 NOTE — Progress Notes (Signed)
Tomasita Morrow, NP-C Phone: [phone]  Todd Black is a 52 y.o. male who presents today for transfer of care. He has no complaints or new concerns today. He is doing well on his medications and requesting refills.   HYPERTENSION Disease Monitoring Home BP Monitoring- Not checking regularly Chest pain- No    Dyspnea- No Medications Compliance-  Norvasc and Lisinopril   Lightheadedness-  No  Edema- No BMET    Component Value Date/Time   NA 136 01/01/2022 1103   K 4.4 01/01/2022 1103   CL 102 01/01/2022 1103   CO2 28 01/01/2022 1103   GLUCOSE 84 01/01/2022 1103   BUN 11 01/01/2022 1103   CREATININE 1.10 01/01/2022 1103   CALCIUM 9.9 01/01/2022 1103   CALCIUM 10.5 09/29/2018 0000   GFRNONAA >60 11/11/2020 1054   GFRAA >60 05/13/2017 2028   HYPERLIPIDEMIA Symptoms Chest pain on exertion:  No   Leg claudication:   No Medications: Compliance- Diet controlled Right upper quadrant pain- No  Muscle aches- No Lipid Panel     Component Value Date/Time   CHOL 190 01/01/2022 1103   TRIG 90.0 01/01/2022 1103   HDL 37.50 (L) 01/01/2022 1103   CHOLHDL 5 01/01/2022 1103   VLDL 18.0 01/01/2022 1103   LDLCALC 135 (H) 01/01/2022 1103    Social History   Tobacco Use  Smoking Status Never  Smokeless Tobacco Never    Current Outpatient Medications on File Prior to Visit  Medication Sig Dispense Refill   ibuprofen (ADVIL,MOTRIN) 200 MG tablet Take 200 mg by mouth every 6 (six) hours as needed.     No current facility-administered medications on file prior to visit.    ROS see history of present illness  Objective  Physical Exam Vitals:   12/09/22 1516 12/09/22 1546  BP: (!) 142/98 132/82  Pulse: 77   Temp: 98.5 F (36.9 C)   SpO2: 98%     BP Readings from Last 3 Encounters:  12/09/22 132/82  06/05/22 120/82  02/23/22 (!) 128/92   Wt Readings from Last 3 Encounters:  12/09/22 204 lb 3.2 oz (92.6 kg)  06/05/22 200 lb 8 oz (90.9 kg)  02/23/22 192 lb 6.3 oz  (87.3 kg)    Physical Exam Constitutional:      General: He is not in acute distress.    Appearance: Normal appearance.  HENT:     Head: Normocephalic.  Cardiovascular:     Rate and Rhythm: Normal rate and regular rhythm.     Heart sounds: Normal heart sounds.  Pulmonary:     Effort: Pulmonary effort is normal.     Breath sounds: Normal breath sounds.  Skin:    General: Skin is warm and dry.  Neurological:     General: No focal deficit present.     Mental Status: He is alert.  Psychiatric:        Mood and Affect: Mood normal.        Behavior: Behavior normal.    Assessment/Plan: Please see individual problem list.  Essential hypertension Assessment & Plan: Chronic. Elevated reading today x 1, improved with second reading. Currently on Norvasc 10 mg daily and Lisinopril 40 mg daily. Continue. Refills sent. Encouraged patient to start checking blood pressure at home and keep a log to bring with him to next appointment. If remaining elevated, will adjust medications. Will continue to monitor.   Orders: -     amLODIPine Besylate; TAKE 1 TABLET(10 MG) BY MOUTH DAILY  Dispense: 90 tablet; Refill: 3 -  Lisinopril; Take 1 tablet (40 mg total) by mouth daily.  Dispense: 90 tablet; Refill: 3  Hyperlipidemia, unspecified hyperlipidemia type Assessment & Plan: Chronic. Diet controlled. Will check lipids at annual exam, if more elevated will add medication. The 10-year ASCVD risk score (Arnett DK, et al., 2019) is: 7%.    NAFLD (nonalcoholic fatty liver disease) Assessment & Plan: Chronic. Encouraged healthy diet and exercise. Will check labs at annual exam.     Return in 26 days (on 01/04/2023) for Annual Exam.   Tomasita Morrow, NP-C [place]

## 2022-12-09 NOTE — Assessment & Plan Note (Addendum)
Chronic. Diet controlled. Will check lipids at annual exam, if more elevated will add medication. The 10-year ASCVD risk score (Arnett DK, et al., 2019) is: 7%.

## 2022-12-09 NOTE — Assessment & Plan Note (Addendum)
Chronic. Elevated reading today x 1, improved with second reading. Currently on Norvasc 10 mg daily and Lisinopril 40 mg daily. Continue. Refills sent. Encouraged patient to start checking blood pressure at home and keep a log to bring with him to next appointment. If remaining elevated, will adjust medications. Will continue to monitor.

## 2022-12-09 NOTE — Assessment & Plan Note (Addendum)
Chronic. Encouraged healthy diet and exercise. Will check labs at annual exam.

## 2022-12-18 ENCOUNTER — Encounter: Payer: BC Managed Care – PPO | Admitting: Nurse Practitioner

## 2023-01-04 ENCOUNTER — Encounter: Payer: Self-pay | Admitting: Nurse Practitioner

## 2023-01-04 ENCOUNTER — Ambulatory Visit (INDEPENDENT_AMBULATORY_CARE_PROVIDER_SITE_OTHER): Payer: BC Managed Care – PPO | Admitting: Nurse Practitioner

## 2023-01-04 VITALS — BP 130/82 | HR 70 | Temp 99.1°F | Ht 67.0 in | Wt 201.6 lb

## 2023-01-04 DIAGNOSIS — Z0000 Encounter for general adult medical examination without abnormal findings: Secondary | ICD-10-CM | POA: Diagnosis not present

## 2023-01-04 DIAGNOSIS — Z125 Encounter for screening for malignant neoplasm of prostate: Secondary | ICD-10-CM | POA: Diagnosis not present

## 2023-01-04 DIAGNOSIS — I10 Essential (primary) hypertension: Secondary | ICD-10-CM | POA: Diagnosis not present

## 2023-01-04 DIAGNOSIS — K760 Fatty (change of) liver, not elsewhere classified: Secondary | ICD-10-CM

## 2023-01-04 DIAGNOSIS — Z1329 Encounter for screening for other suspected endocrine disorder: Secondary | ICD-10-CM | POA: Diagnosis not present

## 2023-01-04 DIAGNOSIS — E785 Hyperlipidemia, unspecified: Secondary | ICD-10-CM | POA: Diagnosis not present

## 2023-01-04 LAB — COMPREHENSIVE METABOLIC PANEL
ALT: 74 U/L — ABNORMAL HIGH (ref 0–53)
AST: 37 U/L (ref 0–37)
Albumin: 4.4 g/dL (ref 3.5–5.2)
Alkaline Phosphatase: 97 U/L (ref 39–117)
BUN: 11 mg/dL (ref 6–23)
CO2: 27 mEq/L (ref 19–32)
Calcium: 9.8 mg/dL (ref 8.4–10.5)
Chloride: 103 mEq/L (ref 96–112)
Creatinine, Ser: 1.17 mg/dL (ref 0.40–1.50)
GFR: 71.25 mL/min (ref >60.00)
Glucose, Bld: 97 mg/dL (ref 70–99)
Potassium: 4.8 mEq/L (ref 3.5–5.1)
Sodium: 139 mEq/L (ref 135–145)
Total Bilirubin: 0.7 mg/dL (ref 0.2–1.2)
Total Protein: 7.3 g/dL (ref 6.0–8.3)

## 2023-01-04 LAB — CBC WITH DIFFERENTIAL/PLATELET
Basophils Absolute: 0 10*3/uL (ref 0.0–0.1)
Basophils Relative: 0.5 % (ref 0.0–3.0)
Eosinophils Absolute: 0.5 10*3/uL (ref 0.0–0.7)
Eosinophils Relative: 6.9 % — ABNORMAL HIGH (ref 0.0–5.0)
HCT: 50.7 % (ref 39.0–52.0)
Hemoglobin: 16.9 g/dL (ref 13.0–17.0)
Lymphocytes Relative: 22.7 % (ref 12.0–46.0)
Lymphs Abs: 1.7 10*3/uL (ref 0.7–4.0)
MCHC: 33.4 g/dL (ref 30.0–36.0)
MCV: 89.8 fl (ref 78.0–100.0)
Monocytes Absolute: 0.6 10*3/uL (ref 0.1–1.0)
Monocytes Relative: 8.8 % (ref 3.0–12.0)
Neutro Abs: 4.5 10*3/uL (ref 1.4–7.7)
Neutrophils Relative %: 61.1 % (ref 43.0–77.0)
Platelets: 304 10*3/uL (ref 150.0–400.0)
RBC: 5.64 Mil/uL (ref 4.22–5.81)
RDW: 14.2 % (ref 11.5–15.5)
WBC: 7.4 10*3/uL (ref 4.0–10.5)

## 2023-01-04 LAB — LIPID PANEL
Cholesterol: 175 mg/dL (ref 0–200)
HDL: 35.7 mg/dL — ABNORMAL LOW (ref >39.00)
LDL Cholesterol: 121 mg/dL — ABNORMAL HIGH (ref 0–99)
NonHDL: 139.17
Total CHOL/HDL Ratio: 5
Triglycerides: 92 mg/dL (ref 0.0–149.0)
VLDL: 18.4 mg/dL (ref 0.0–40.0)

## 2023-01-04 LAB — HEMOGLOBIN A1C: Hgb A1c MFr Bld: 6 % (ref 4.6–6.5)

## 2023-01-04 LAB — PSA: PSA: 0.59 ng/mL (ref 0.10–4.00)

## 2023-01-04 LAB — TSH: TSH: 1.83 u[IU]/mL (ref 0.35–5.50)

## 2023-01-04 NOTE — Progress Notes (Signed)
[name], NP-C Phone: [phone]  Todd Black is a 52 y.o. male who presents today for annual exam. He has no complaints or new concerns today. He is doing well on his medications.   HYPERTENSION Disease Monitoring Home BP Monitoring- 130s/80s Chest pain- No    Dyspnea- No Medications Compliance-  Norvasc and Lisinopril.  Lightheadedness-  No  Edema- No BMET    Component Value Date/Time   NA 136 01/01/2022 1103   K 4.4 01/01/2022 1103   CL 102 01/01/2022 1103   CO2 28 01/01/2022 1103   GLUCOSE 84 01/01/2022 1103   BUN 11 01/01/2022 1103   CREATININE 1.10 01/01/2022 1103   CALCIUM 9.9 01/01/2022 1103   CALCIUM 10.5 09/29/2018 0000   GFRNONAA >60 11/11/2020 1054   GFRAA >60 05/13/2017 2028    Diet: Fair- meat and potatoes, trying to increase vegetables, drinks 1-2 sodas per day Exercise: Plays tennis 3-4 days per week, active job- walks a lot Colonoscopy: 02/23/2022- 10 year recall Prostate cancer screening: Today Family history-  Prostate cancer: Yes- grandfather  Colon cancer: No Sexually active: Yes Vaccines-   Flu: UTD  Tetanus: 09/06/2018  Shingles: Interested  COVID19: x 4 HIV screening: Negative Hep C Screening: Negative Tobacco use: No Alcohol use: No Illicit Drug use: No Dentist: No Ophthalmology: Yes   Social History   Tobacco Use  Smoking Status Never  Smokeless Tobacco Never    Current Outpatient Medications on File Prior to Visit  Medication Sig Dispense Refill   amLODipine (NORVASC) 10 MG tablet TAKE 1 TABLET(10 MG) BY MOUTH DAILY 90 tablet 3   ibuprofen (ADVIL,MOTRIN) 200 MG tablet Take 200 mg by mouth every 6 (six) hours as needed.     lisinopril (ZESTRIL) 40 MG tablet Take 1 tablet (40 mg total) by mouth daily. 90 tablet 3   No current facility-administered medications on file prior to visit.    ROS see history of present illness  Objective  Physical Exam Vitals:   01/04/23 0807  BP: 130/82  Pulse: 70  Temp: 99.1 F  (37.3 C)  SpO2: 97%    BP Readings from Last 3 Encounters:  01/04/23 130/82  12/09/22 132/82  06/05/22 120/82   Wt Readings from Last 3 Encounters:  01/04/23 201 lb 9.6 oz (91.4 kg)  12/09/22 204 lb 3.2 oz (92.6 kg)  06/05/22 200 lb 8 oz (90.9 kg)    Physical Exam Constitutional:      General: He is not in acute distress.    Appearance: Normal appearance.  HENT:     Head: Normocephalic.     Right Ear: Tympanic membrane normal.     Left Ear: Tympanic membrane normal.     Nose: Nose normal.     Mouth/Throat:     Mouth: Mucous membranes are moist.     Pharynx: Oropharynx is clear.  Eyes:     Conjunctiva/sclera: Conjunctivae normal.     Pupils: Pupils are equal, round, and reactive to light.  Neck:     Thyroid: No thyromegaly.  Cardiovascular:     Rate and Rhythm: Normal rate and regular rhythm.     Heart sounds: Normal heart sounds.  Pulmonary:     Effort: Pulmonary effort is normal.     Breath sounds: Normal breath sounds.  Abdominal:     General: Abdomen is flat. Bowel sounds are normal.     Palpations: Abdomen is soft. There is no mass.     Tenderness: There is no abdominal tenderness.  Musculoskeletal:  General: Normal range of motion.  Lymphadenopathy:     Cervical: No cervical adenopathy.  Skin:    General: Skin is warm and dry.     Findings: No rash.  Neurological:     General: No focal deficit present.     Mental Status: He is alert.  Psychiatric:        Mood and Affect: Mood normal.        Behavior: Behavior normal.    Assessment/Plan: Please see individual problem list.  Preventative health care Assessment & Plan: Physical exam complete. Lab work as outlined. Will contact patient with results. Colonoscopy- UTD. PSA- Today. Flu and tetanus vaccines- UTD. Declined additional COVID vaccines. Shingles vaccine- interested. Information provided to patient, encouraged to get. He can come back for this or get at his local pharmacy. HIV/Hep C  screenings negative. Recommended establishing with Dentist and follow up with Ophthalmology for annual exams. Encouraged healthy diet and to continue exercising. Return to care in 6 months, sooner PRN.   Orders: -     Lipid panel -     PSA -     TSH -     Comprehensive metabolic panel -     CBC with Differential/Platelet -     Hemoglobin A1c  Essential hypertension Assessment & Plan: Chronic. Stable on Norvasc and Lisinopril. Continue. Home BP log reviewed, consistently 120-130s/80s. Encouraged patient to continue checking blood pressure at home and contact if staying consistently over 140/90. Will continue to monitor.   Orders: -     CBC with Differential/Platelet  NAFLD (nonalcoholic fatty liver disease) -     Comprehensive metabolic panel  Hyperlipidemia, unspecified hyperlipidemia type -     Lipid panel  Screening PSA (prostate specific antigen) -     PSA  Thyroid disorder screen -     TSH    Return in about 6 months (around 07/06/2023) for Follow up.   [name], NP-C [facility] [facility]

## 2023-01-04 NOTE — Assessment & Plan Note (Signed)
Chronic. Stable on Norvasc and Lisinopril. Continue. Home BP log reviewed, consistently 120-130s/80s. Encouraged patient to continue checking blood pressure at home and contact if staying consistently over 140/90. Will continue to monitor.

## 2023-01-04 NOTE — Assessment & Plan Note (Signed)
Physical exam complete. Lab work as outlined. Will contact patient with results. Colonoscopy- UTD. PSA- Today. Flu and tetanus vaccines- UTD. Declined additional COVID vaccines. Shingles vaccine- interested. Information provided to patient, encouraged to get. He can come back for this or get at his local pharmacy. HIV/Hep C screenings negative. Recommended establishing with Dentist and follow up with Ophthalmology for annual exams. Encouraged healthy diet and to continue exercising. Return to care in 6 months, sooner PRN.

## 2023-07-06 ENCOUNTER — Ambulatory Visit: Payer: BC Managed Care – PPO | Admitting: Nurse Practitioner

## 2023-07-28 ENCOUNTER — Ambulatory Visit: Payer: BC Managed Care – PPO | Admitting: Nurse Practitioner

## 2023-07-28 ENCOUNTER — Telehealth: Payer: Self-pay

## 2023-07-28 ENCOUNTER — Encounter: Payer: Self-pay | Admitting: Nurse Practitioner

## 2023-07-28 VITALS — BP 120/92 | HR 74 | Temp 98.3°F | Ht 67.0 in | Wt 202.6 lb

## 2023-07-28 DIAGNOSIS — R7303 Prediabetes: Secondary | ICD-10-CM | POA: Diagnosis not present

## 2023-07-28 DIAGNOSIS — E785 Hyperlipidemia, unspecified: Secondary | ICD-10-CM | POA: Diagnosis not present

## 2023-07-28 DIAGNOSIS — Z1329 Encounter for screening for other suspected endocrine disorder: Secondary | ICD-10-CM | POA: Diagnosis not present

## 2023-07-28 DIAGNOSIS — I10 Essential (primary) hypertension: Secondary | ICD-10-CM

## 2023-07-28 DIAGNOSIS — K760 Fatty (change of) liver, not elsewhere classified: Secondary | ICD-10-CM | POA: Diagnosis not present

## 2023-07-28 DIAGNOSIS — Z23 Encounter for immunization: Secondary | ICD-10-CM | POA: Diagnosis not present

## 2023-07-28 LAB — HEPATIC FUNCTION PANEL
ALT: 66 U/L — ABNORMAL HIGH (ref 0–53)
AST: 33 U/L (ref 0–37)
Albumin: 4.7 g/dL (ref 3.5–5.2)
Alkaline Phosphatase: 109 U/L (ref 39–117)
Bilirubin, Direct: 0.2 mg/dL (ref 0.0–0.3)
Total Bilirubin: 1 mg/dL (ref 0.2–1.2)
Total Protein: 7.5 g/dL (ref 6.0–8.3)

## 2023-07-28 LAB — LIPID PANEL
Cholesterol: 203 mg/dL — ABNORMAL HIGH (ref 0–200)
HDL: 33.8 mg/dL — ABNORMAL LOW (ref >39.00)
LDL Cholesterol: 150 mg/dL — ABNORMAL HIGH (ref 0–99)
NonHDL: 168.7
Total CHOL/HDL Ratio: 6
Triglycerides: 93 mg/dL (ref 0.0–149.0)
VLDL: 18.6 mg/dL (ref 0.0–40.0)

## 2023-07-28 LAB — BASIC METABOLIC PANEL
BUN: 13 mg/dL (ref 6–23)
CO2: 30 meq/L (ref 19–32)
Calcium: 10.3 mg/dL (ref 8.4–10.5)
Chloride: 102 meq/L (ref 96–112)
Creatinine, Ser: 1.15 mg/dL (ref 0.40–1.50)
GFR: 72.46 mL/min (ref >60.00)
Glucose, Bld: 110 mg/dL — ABNORMAL HIGH (ref 70–99)
Potassium: 4.9 meq/L (ref 3.5–5.1)
Sodium: 138 meq/L (ref 135–145)

## 2023-07-28 LAB — CBC WITH DIFFERENTIAL/PLATELET
Basophils Absolute: 0.1 10*3/uL (ref 0.0–0.1)
Basophils Relative: 0.9 % (ref 0.0–3.0)
Eosinophils Absolute: 0.3 10*3/uL (ref 0.0–0.7)
Eosinophils Relative: 4.3 % (ref 0.0–5.0)
HCT: 50.8 % (ref 39.0–52.0)
Hemoglobin: 16.6 g/dL (ref 13.0–17.0)
Lymphocytes Relative: 23.9 % (ref 12.0–46.0)
Lymphs Abs: 1.8 10*3/uL (ref 0.7–4.0)
MCHC: 32.8 g/dL (ref 30.0–36.0)
MCV: 90 fL (ref 78.0–100.0)
Monocytes Absolute: 0.7 10*3/uL (ref 0.1–1.0)
Monocytes Relative: 8.6 % (ref 3.0–12.0)
Neutro Abs: 4.7 10*3/uL (ref 1.4–7.7)
Neutrophils Relative %: 62.3 % (ref 43.0–77.0)
Platelets: 341 10*3/uL (ref 150.0–400.0)
RBC: 5.64 Mil/uL (ref 4.22–5.81)
RDW: 14.1 % (ref 11.5–15.5)
WBC: 7.6 10*3/uL (ref 4.0–10.5)

## 2023-07-28 LAB — HEMOGLOBIN A1C: Hgb A1c MFr Bld: 6 % (ref 4.6–6.5)

## 2023-07-28 LAB — TSH: TSH: 1.67 u[IU]/mL (ref 0.35–5.50)

## 2023-07-28 NOTE — Telephone Encounter (Signed)
LVM informing that I was calling in regards to labs have sent  a mychart msg on info as well

## 2023-07-28 NOTE — Assessment & Plan Note (Signed)
His last A1C was 6.0, indicating prediabetes. He has made dietary changes and engages in regular exercise, including tennis 3-4 days per week. We will order labs to recheck his A1C. Encouraged to continue working on healthy diet and exercising.

## 2023-07-28 NOTE — Assessment & Plan Note (Signed)
He is not currently on any medications for his cholesterol. The 10-year ASCVD risk score (Arnett DK, et al., 2019) is: 5.6%. We will check lipid panel today. Encouraged healthy diet and exercise.

## 2023-07-28 NOTE — Progress Notes (Signed)
Todd Dicker, NP-C Phone: [phone]  Todd Black is a 52 y.o. male who presents today for follow up.   Discussed the use of AI scribe software for clinical note transcription with the patient, who gave verbal consent to proceed.  History of Present Illness   The patient, on a regimen of Norvasc 10mg  and Lisinopril 40mg  for hypertension, reports stable blood pressure readings at home, typically in the range of 120s-130s over 80s. He denies any symptoms of chest pain, shortness of breath, dizziness, or swelling. 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  Regarding his cholesterol, the patient is not on any medication and reports a mixed dietary pattern. He eats healthily at home, thanks to his spouse's cooking, but his work situation has limited his dietary control. He anticipates an improvement in his diet once he can set up a refrigerator in his newly renovated office.  The patient is physically active, playing tennis three to four times a week. His last lab results showed an A1c of 6.0, indicating prediabetes, and slightly elevated liver enzymes. He has a history of fatty liver, for which he underwent a biopsy and a CT scan with contrast in the early 2000s. At that time, his liver enzymes were significantly higher, around 150-160. He denies any current abdominal pain.  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 He recently changed jobs, three months ago and has still been adjusting to his new role.      Social History   Tobacco Use  Smoking Status Never  Smokeless Tobacco Never    Current Outpatient Medications on File Prior to Visit  Medication Sig Dispense Refill   amLODipine (NORVASC) 10 MG tablet TAKE 1 TABLET(10 MG) BY MOUTH DAILY 90 tablet 3   ibuprofen (ADVIL,MOTRIN) 200 MG tablet Take 200 mg by  mouth every 6 (six) hours as needed.     lisinopril (ZESTRIL) 40 MG tablet Take 1 tablet (40 mg total) by mouth daily. 90 tablet 3   No current facility-administered medications on file prior to visit.    ROS see history of present illness  Objective  Physical Exam Vitals:   07/28/23 0809 07/28/23 0818  BP: (!) 128/90 (!) 120/92  Pulse: 74   Temp: 98.3 F (36.8 C)   SpO2: 99%     BP Readings from Last 3 Encounters:  07/28/23 (!) 120/92  01/04/23 130/82  12/09/22 132/82   Wt Readings from Last 3 Encounters:  07/28/23 202 lb 9.6 oz (91.9 kg)  01/04/23 201 lb 9.6 oz (91.4 kg)  12/09/22 204 lb 3.2 oz (92.6 kg)    Physical Exam Constitutional:      General: He is not in acute distress.    Appearance: Normal appearance.  HENT:     Head: Normocephalic.  Cardiovascular:     Rate and Rhythm: Normal rate and regular rhythm.     Heart sounds: Normal heart sounds.  Pulmonary:     Effort: Pulmonary effort is normal.     Breath sounds: Normal breath sounds.  Skin:    General: Skin is warm and dry.  Neurological:     General: No focal deficit present.     Mental Status: He is alert.  Psychiatric:        Mood and Affect: Mood normal.        Behavior: Behavior normal.  Assessment/Plan: Please see individual problem list.  Essential hypertension Assessment & Plan: His hypertension is well-controlled on Norvasc 10mg  daily and Lisinopril 40mg  daily, with home readings consistently in the 120s-130s/80s range. He reports no symptoms such as chest pain, shortness of breath, dizziness, or swelling. We will continue his current medications and recheck his blood pressure today due to a slightly elevated reading of 128/90, which may be attributed to stress. He will continue to monitor his blood pressure at home and contact if it remains consistently elevated, diastolic in the 90s.   Orders: -     CBC with Differential/Platelet -     Basic metabolic panel  Hyperlipidemia,  unspecified hyperlipidemia type Assessment & Plan: He is not currently on any medications for his cholesterol. The 10-year ASCVD risk score (Arnett DK, et al., 2019) is: 5.6%. We will check lipid panel today. Encouraged healthy diet and exercise.   Orders: -     Lipid panel  Prediabetes Assessment & Plan: His last A1C was 6.0, indicating prediabetes. He has made dietary changes and engages in regular exercise, including tennis 3-4 days per week. We will order labs to recheck his A1C. Encouraged to continue working on healthy diet and exercising.   Orders: -     Hemoglobin A1c  NAFLD (nonalcoholic fatty liver disease) Assessment & Plan: He has a history of elevated liver enzymes, a liver biopsy, and a CT scan with contrast in the early 2000s but reports no current abdominal pain. We will order labs to recheck his liver enzymes. If liver enzymes remain elevated, we will order an abdominal ultrasound for further evaluation.   Orders: -     Hepatic function panel  Need for influenza vaccination -     Flu vaccine trivalent PF, 6mos and older(Flulaval,Afluria,Fluarix,Fluzone)  Thyroid disorder screen -     TSH   Return in about 5 months (around 01/06/2024) for Annual Exam.   Todd Dicker, NP-C [facility] - [employer]

## 2023-07-28 NOTE — Assessment & Plan Note (Signed)
He has a history of elevated liver enzymes, a liver biopsy, and a CT scan with contrast in the early 2000s but reports no current abdominal pain. We will order labs to recheck his liver enzymes. If liver enzymes remain elevated, we will order an abdominal ultrasound for further evaluation.

## 2023-07-28 NOTE — Assessment & Plan Note (Signed)
His hypertension is well-controlled on Norvasc 10mg  daily and Lisinopril 40mg  daily, with home readings consistently in the 120s-130s/80s range. He reports no symptoms such as chest pain, shortness of breath, dizziness, or swelling. We will continue his current medications and recheck his blood pressure today due to a slightly elevated reading of 128/90, which may be attributed to stress. He will continue to monitor his blood pressure at home and contact if it remains consistently elevated, diastolic in the 90s.

## 2023-07-29 ENCOUNTER — Telehealth: Payer: Self-pay | Admitting: Nurse Practitioner

## 2023-07-29 ENCOUNTER — Other Ambulatory Visit: Payer: Self-pay | Admitting: Nurse Practitioner

## 2023-07-29 DIAGNOSIS — K760 Fatty (change of) liver, not elsewhere classified: Secondary | ICD-10-CM

## 2023-07-29 NOTE — Telephone Encounter (Signed)
Lft pt vm to call ofc to sch [place]. thanks ?

## 2023-08-02 ENCOUNTER — Telehealth: Payer: Self-pay | Admitting: Nurse Practitioner

## 2023-08-02 NOTE — Telephone Encounter (Signed)
Lft pt vm to call ofc to sch [place]. thanks

## 2023-08-26 ENCOUNTER — Ambulatory Visit
Admission: RE | Admit: 2023-08-26 | Discharge: 2023-08-26 | Disposition: A | Discharge status: Home / Self Care | Payer: BC Managed Care – PPO | Source: Ambulatory Visit | Attending: Nurse Practitioner | Admitting: Nurse Practitioner

## 2023-08-26 DIAGNOSIS — R7989 Other specified abnormal findings of blood chemistry: Secondary | ICD-10-CM | POA: Diagnosis not present

## 2023-08-26 DIAGNOSIS — K760 Fatty (change of) liver, not elsewhere classified: Secondary | ICD-10-CM | POA: Diagnosis not present

## 2023-08-26 DIAGNOSIS — K7689 Other specified diseases of liver: Secondary | ICD-10-CM | POA: Diagnosis not present

## 2023-08-27 ENCOUNTER — Other Ambulatory Visit: Payer: Self-pay | Admitting: Nurse Practitioner

## 2023-08-27 DIAGNOSIS — K7460 Unspecified cirrhosis of liver: Secondary | ICD-10-CM

## 2023-10-06 ENCOUNTER — Telehealth: Payer: Self-pay

## 2023-10-06 NOTE — Telephone Encounter (Signed)
I schedule the patient with the wrong provider I called the patient to reschedule appointment with Dr, Tobi Bastos. The patient said that he is a bit confused the nurse was very rude to him and advised him that she needs to be seen soon as possible but he is closed to liver failure. He said that he don't believe her because he seen his result. I did offer him 10/19/23 but he is unable to take that day because of his schedule. I did schedule him with Dr. Tobi Bastos on 11/25/23.

## 2023-10-06 NOTE — Telephone Encounter (Signed)
Patient is schedule with Dr. Allegra Lai on 11/15/2023  but patient is a dr. Tobi Bastos patient because Dr. Tobi Bastos did his colonoscopy on 02/23/2022. Please move patient to Dr. Tobi Bastos schedule.

## 2023-11-15 ENCOUNTER — Ambulatory Visit: Payer: BC Managed Care – PPO | Admitting: Gastroenterology

## 2023-11-25 ENCOUNTER — Ambulatory Visit: Payer: BC Managed Care – PPO | Admitting: Gastroenterology

## 2023-11-25 VITALS — BP 147/95 | HR 88 | Temp 97.8°F | Wt 205.0 lb

## 2023-11-25 DIAGNOSIS — R7989 Other specified abnormal findings of blood chemistry: Secondary | ICD-10-CM | POA: Diagnosis not present

## 2023-11-25 DIAGNOSIS — R932 Abnormal findings on diagnostic imaging of liver and biliary tract: Secondary | ICD-10-CM

## 2023-11-25 NOTE — Progress Notes (Signed)
 Wyline Mood MD, MRCP(U.K) [address]  [address]  [place], [place] [address]  Main: [phone]  Fax: [phone]   Gastroenterology Consultation  Referring Provider:     [name], NP Primary Care Physician:  [name], NP Primary Gastroenterologist:  Dr. Wyline Mood  Reason for Consultation:     Liver cirrhosis        HPI:   Todd Black is a 53 y.o. y/o male referred for consultation & management  by [name], NP.    He says he has noted to have abnormal liver function test for some years but this is the lowest it has ever been.  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  Never required a blood transfusion no tattoos.  No family history of liver disease not had a drink of alcohol in many years.  Recollects he has been vaccinated for hepatitis B in the past.  He works in Environmental manager.  No symptoms or concerns presently.  This is the heaviest he has weight.  He does exercise and play tennis regularly.  08/26/2023 right upper quadrant ultrasound shows mildly coarsened echogenicity and nodular contour of the liver nonspecific but can be seen in setting of cirrhosis 07/28/2023 platelet count 341 hemoglobin 16.6, ALT of 66 alkaline phosphatase 109 albumin of 4.7 total bilirubin of 0.2 Past Medical History:  Diagnosis Date   Allergy 1980   Arthritis    History of bloody stools    History of dermoid cyst excision    Hyperlipidemia    Hypertension     Past Surgical History:  Procedure Laterality Date   COLONOSCOPY WITH PROPOFOL N/A 02/23/2022   Procedure: COLONOSCOPY WITH PROPOFOL;  Surgeon: Wyline Mood, MD;  [facility] ENDOSCOPY;  Service: Gastroenterology;  Laterality: N/A;   DERMOID CYST  EXCISION     LIVER BIOPSY     RADICAL ORCHIECTOMY  1999   SHOULDER ARTHROSCOPY Right 2005   Dr Lindaann Slough in [place]    Prior to Admission medications   Medication Sig Start Date End Date  Taking? Authorizing Provider  amLODipine (NORVASC) 10 MG tablet TAKE 1 TABLET(10 MG) BY MOUTH DAILY 12/09/22   [name], NP  ibuprofen (ADVIL,MOTRIN) 200 MG tablet Take 200 mg by mouth every 6 (six) hours as needed.    [provider]  lisinopril (ZESTRIL) 40 MG tablet Take 1 tablet (40 mg total) by mouth daily. 12/09/22   [name], NP    Family History  Problem Relation Age of Onset   Arthritis Father    Hyperlipidemia Father    Hypertension Father    Skin cancer Father        non-melanoma   Crohn's disease Sister    Supraventricular tachycardia Brother    Heart attack Maternal Grandfather 46     Social History   Tobacco Use   Smoking status: Never   Smokeless tobacco: Never  Vaping Use   Vaping status: Never Used  Substance Use Topics   Alcohol use: No   Drug use: Never    Allergies as of 11/25/2023 - Review Complete 11/25/2023  Allergen Reaction Noted   Bee venom  05/13/2017    Review of Systems:    All systems reviewed and negative except where noted in HPI.   Physical Exam:  BP (!) 147/95   Pulse 88   Temp 97.8 F (36.6 C) (Oral)   Wt 205 lb (93 kg)  BMI 32.11 kg/m  No LMP for male patient. Psych:  Alert and cooperative. Normal mood and affect. General:   Alert,  Well-developed, well-nourished, pleasant and cooperative in NAD Head:  Normocephalic and atraumatic. Eyes:  Sclera clear, no icterus.   Conjunctiva pink. Neurologic:  Alert and oriented x3;  grossly normal neurologically. Psych:  Alert and cooperative. Normal mood and affect.  Imaging Studies: No results found.  Assessment and Plan:   Todd Black is a 53 y.o. y/o male has been referred for liver cirrhosis finding seen on ultrasound of the liver.  There are no features of portal hypertension seen on imaging.  No splenomegaly on CT abdomen in 2022.  Normal platelet count normal total bilirubin normal serum albumin.  These findings make it less likely that he has significant  portal hypertension or cirrhosis.  His liver function tests are mildly elevated could be due to nonalcoholic fatty liver disease .   Fibrosis 4 Score = .64 (Low risk)         Plan 1.  Full autoimmune and viral hepatitis workup, NAFLD FibroSure test, elastography of the liver to deter mine stiffness.  2.  He does not even meet criteria at this point of time to consider medication such as Resdiffra  for  metabolic liver disease.  If blood work does not show any abnormality the cornerstone of therapy would be a Mediterranean diet exercise lose weight, address any cardiovascular risk factors with regular screening, limit use of alcohol and repeat the fibrosis 4 score in a year by his primary care provider and if it is over 1.4 to refer back to GI.    Follow up in 6 months  Dr Wyline Mood MD,MRCP(U.K)

## 2023-11-25 NOTE — Patient Instructions (Addendum)
 Your ultrasound is scheduled on 11/30/2023 and arrive at 7:[facility]. Please do not eat or drink after midnight the night before.

## 2023-11-30 ENCOUNTER — Ambulatory Visit
Admission: RE | Admit: 2023-11-30 | Discharge: 2023-11-30 | Disposition: A | Discharge status: Home / Self Care | Source: Ambulatory Visit | Attending: Gastroenterology | Admitting: Gastroenterology

## 2023-11-30 DIAGNOSIS — R932 Abnormal findings on diagnostic imaging of liver and biliary tract: Secondary | ICD-10-CM | POA: Insufficient documentation

## 2023-11-30 DIAGNOSIS — K759 Inflammatory liver disease, unspecified: Secondary | ICD-10-CM | POA: Diagnosis not present

## 2023-11-30 DIAGNOSIS — R7989 Other specified abnormal findings of blood chemistry: Secondary | ICD-10-CM | POA: Diagnosis not present

## 2023-11-30 DIAGNOSIS — Z944 Liver transplant status: Secondary | ICD-10-CM | POA: Diagnosis not present

## 2023-11-30 DIAGNOSIS — K709 Alcoholic liver disease, unspecified: Secondary | ICD-10-CM | POA: Diagnosis not present

## 2023-12-02 ENCOUNTER — Encounter: Payer: Self-pay | Admitting: Gastroenterology

## 2023-12-02 LAB — IRON,TIBC AND FERRITIN PANEL
Ferritin: 282 ng/mL (ref 30–400)
Iron Saturation: 25 % (ref 15–55)
Iron: 65 ug/dL (ref 38–169)
Total Iron Binding Capacity: 256 ug/dL (ref 250–450)
UIBC: 191 ug/dL (ref 111–343)

## 2023-12-02 LAB — NASH FIBROSURE(R) PLUS
ALPHA 2-MACROGLOBULINS, QN: 366 mg/dL — ABNORMAL HIGH (ref 110–276)
ALT (SGPT) P5P: 90 IU/L — ABNORMAL HIGH (ref 0–55)
AST (SGOT) P5P: 44 IU/L — ABNORMAL HIGH (ref 0–40)
Apolipoprotein A-1: 121 mg/dL (ref 101–178)
Bilirubin, Total: 0.2 mg/dL (ref 0.0–1.2)
Cholesterol, Total: 204 mg/dL — ABNORMAL HIGH (ref 100–199)
Fibrosis Score: 0.61 — ABNORMAL HIGH (ref 0.00–0.21)
GGT: 243 IU/L — ABNORMAL HIGH (ref 0–65)
Glucose: 128 mg/dL — ABNORMAL HIGH (ref 70–99)
Haptoglobin: 159 mg/dL (ref 29–370)
NASH Score: 0.71 — ABNORMAL HIGH (ref 0.00–0.25)
Steatosis Score: 0.81 — ABNORMAL HIGH (ref 0.00–0.40)
Triglycerides: 159 mg/dL — ABNORMAL HIGH (ref 0–149)

## 2023-12-02 LAB — HIV ANTIBODY (ROUTINE TESTING W REFLEX): HIV Screen 4th Generation wRfx: NONREACTIVE

## 2023-12-02 LAB — HEPATITIS C ANTIBODY: Hep C Virus Ab: NONREACTIVE

## 2023-12-02 LAB — ENDOMYSIAL IGA ANTIBODY

## 2023-12-02 LAB — CERULOPLASMIN: Ceruloplasmin: 21.2 mg/dL (ref 16.0–31.0)

## 2023-12-02 LAB — MITOCHONDRIAL/SMOOTH MUSCLE AB PNL
Mitochondrial Ab: <20 U (ref 0.0–20.0)
Smooth Muscle Ab: 8 U (ref 0–19)

## 2023-12-02 LAB — IMMUNOGLOBULINS A/E/G/M, SERUM
IgA/Immunoglobulin A, Serum: 613 mg/dL — ABNORMAL HIGH (ref 90–386)
IgE (Immunoglobulin E), Serum: 525 [IU]/mL — ABNORMAL HIGH (ref 6–495)
IgG (Immunoglobin G), Serum: 1217 mg/dL (ref 603–1613)
IgM (Immunoglobulin M), Srm: 81 mg/dL (ref 20–172)

## 2023-12-02 LAB — CK: Total CK: 88 U/L (ref 41–331)

## 2023-12-02 LAB — HEPATITIS B E ANTIGEN: Hep B E Ag: NEGATIVE

## 2023-12-02 LAB — ALPHA-1-ANTITRYPSIN: A-1 Antitrypsin: 94 mg/dL — ABNORMAL LOW (ref 101–187)

## 2023-12-02 LAB — HEPATITIS A ANTIBODY, TOTAL: hep A Total Ab: POSITIVE — AB

## 2023-12-02 LAB — HEPATITIS B E ANTIBODY: Hep B E Ab: NONREACTIVE

## 2023-12-02 LAB — HEPATITIS B SURFACE ANTIBODY,QUALITATIVE: Hep B Surface Ab, Qual: NONREACTIVE

## 2023-12-02 LAB — HEPATITIS B SURFACE ANTIGEN: Hepatitis B Surface Ag: NEGATIVE

## 2023-12-02 LAB — CELIAC DISEASE AB SCREEN W/RFX
Antigliadin Abs, IgA: 10 U (ref 0–19)
Transglutaminase IgA: 4 U/mL — ABNORMAL HIGH (ref 0–3)

## 2023-12-02 LAB — ANTI-MICROSOMAL ANTIBODY LIVER / KIDNEY: LKM1 Ab: 1.3 U (ref 0.0–20.0)

## 2023-12-02 LAB — ANA: Anti Nuclear Antibody (ANA): NEGATIVE

## 2023-12-02 LAB — HEPATITIS B CORE ANTIBODY, TOTAL: Hep B Core Total Ab: NEGATIVE

## 2024-01-07 ENCOUNTER — Encounter: Payer: BC Managed Care – PPO | Admitting: Nurse Practitioner

## 2024-02-17 ENCOUNTER — Other Ambulatory Visit: Payer: Self-pay | Admitting: Nurse Practitioner

## 2024-02-17 DIAGNOSIS — I10 Essential (primary) hypertension: Secondary | ICD-10-CM

## 2024-02-25 ENCOUNTER — Ambulatory Visit (INDEPENDENT_AMBULATORY_CARE_PROVIDER_SITE_OTHER): Admitting: Nurse Practitioner

## 2024-02-25 ENCOUNTER — Encounter: Payer: Self-pay | Admitting: Nurse Practitioner

## 2024-02-25 VITALS — BP 124/76 | HR 77 | Temp 98.4°F | Resp 20 | Ht 67.0 in | Wt 199.1 lb

## 2024-02-25 DIAGNOSIS — I10 Essential (primary) hypertension: Secondary | ICD-10-CM | POA: Diagnosis not present

## 2024-02-25 DIAGNOSIS — Z125 Encounter for screening for malignant neoplasm of prostate: Secondary | ICD-10-CM

## 2024-02-25 DIAGNOSIS — M25511 Pain in right shoulder: Secondary | ICD-10-CM | POA: Diagnosis not present

## 2024-02-25 DIAGNOSIS — Z0000 Encounter for general adult medical examination without abnormal findings: Secondary | ICD-10-CM | POA: Diagnosis not present

## 2024-02-25 DIAGNOSIS — L989 Disorder of the skin and subcutaneous tissue, unspecified: Secondary | ICD-10-CM | POA: Diagnosis not present

## 2024-02-25 DIAGNOSIS — E785 Hyperlipidemia, unspecified: Secondary | ICD-10-CM

## 2024-02-25 DIAGNOSIS — Z0001 Encounter for general adult medical examination with abnormal findings: Secondary | ICD-10-CM

## 2024-02-25 DIAGNOSIS — Z1329 Encounter for screening for other suspected endocrine disorder: Secondary | ICD-10-CM

## 2024-02-25 DIAGNOSIS — G8929 Other chronic pain: Secondary | ICD-10-CM | POA: Diagnosis not present

## 2024-02-25 DIAGNOSIS — R7303 Prediabetes: Secondary | ICD-10-CM

## 2024-02-25 DIAGNOSIS — R053 Chronic cough: Secondary | ICD-10-CM

## 2024-02-25 DIAGNOSIS — K760 Fatty (change of) liver, not elsewhere classified: Secondary | ICD-10-CM

## 2024-02-25 LAB — HEPATIC FUNCTION PANEL
ALT: 66 U/L — ABNORMAL HIGH (ref 0–53)
AST: 29 U/L (ref 0–37)
Albumin: 4.7 g/dL (ref 3.5–5.2)
Alkaline Phosphatase: 105 U/L (ref 39–117)
Bilirubin, Direct: 0.2 mg/dL (ref 0.0–0.3)
Total Bilirubin: 0.9 mg/dL (ref 0.2–1.2)
Total Protein: 7.8 g/dL (ref 6.0–8.3)

## 2024-02-25 LAB — BASIC METABOLIC PANEL WITH GFR
BUN: 14 mg/dL (ref 6–23)
CO2: 30 meq/L (ref 19–32)
Calcium: 10.4 mg/dL (ref 8.4–10.5)
Chloride: 101 meq/L (ref 96–112)
Creatinine, Ser: 1.17 mg/dL (ref 0.40–1.50)
GFR: 70.68 mL/min (ref >60.00)
Glucose, Bld: 126 mg/dL — ABNORMAL HIGH (ref 70–99)
Potassium: 4.4 meq/L (ref 3.5–5.1)
Sodium: 139 meq/L (ref 135–145)

## 2024-02-25 LAB — LIPID PANEL
Cholesterol: 192 mg/dL (ref 0–200)
HDL: 31.3 mg/dL — ABNORMAL LOW (ref >39.00)
LDL Cholesterol: 132 mg/dL — ABNORMAL HIGH (ref 0–99)
NonHDL: 161.11
Total CHOL/HDL Ratio: 6
Triglycerides: 146 mg/dL (ref 0.0–149.0)
VLDL: 29.2 mg/dL (ref 0.0–40.0)

## 2024-02-25 LAB — CBC WITH DIFFERENTIAL/PLATELET
Basophils Absolute: 0 10*3/uL (ref 0.0–0.1)
Basophils Relative: 0.5 % (ref 0.0–3.0)
Eosinophils Absolute: 0.5 10*3/uL (ref 0.0–0.7)
Eosinophils Relative: 5.7 % — ABNORMAL HIGH (ref 0.0–5.0)
HCT: 51.3 % (ref 39.0–52.0)
Hemoglobin: 17 g/dL (ref 13.0–17.0)
Lymphocytes Relative: 20.2 % (ref 12.0–46.0)
Lymphs Abs: 1.8 10*3/uL (ref 0.7–4.0)
MCHC: 33.1 g/dL (ref 30.0–36.0)
MCV: 87.4 fl (ref 78.0–100.0)
Monocytes Absolute: 0.7 10*3/uL (ref 0.1–1.0)
Monocytes Relative: 7.8 % (ref 3.0–12.0)
Neutro Abs: 5.9 10*3/uL (ref 1.4–7.7)
Neutrophils Relative %: 65.8 % (ref 43.0–77.0)
Platelets: 357 10*3/uL (ref 150.0–400.0)
RBC: 5.86 Mil/uL — ABNORMAL HIGH (ref 4.22–5.81)
RDW: 13.2 % (ref 11.5–15.5)
WBC: 8.9 10*3/uL (ref 4.0–10.5)

## 2024-02-25 LAB — HEMOGLOBIN A1C: Hgb A1c MFr Bld: 6 % (ref 4.6–6.5)

## 2024-02-25 LAB — TSH: TSH: 2.19 u[IU]/mL (ref 0.35–5.50)

## 2024-02-25 LAB — PSA: PSA: 0.77 ng/mL (ref 0.10–4.00)

## 2024-02-25 MED ORDER — LISINOPRIL 40 MG PO TABS: 40.0000 mg | ORAL_TABLET | Freq: Every day | ORAL | 3 refills | Status: DC

## 2024-02-25 NOTE — Progress Notes (Signed)
 Leron Glance, NP-C Phone: [phone]  Todd Black is a 53 y.o. male who presents today for annual exam.   Discussed the use of AI scribe software for clinical note transcription with the patient, who gave verbal consent to proceed.  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  He is following up on previous diagnostic results related to elevated celiac antibodies and liver fibrosis. He underwent several blood tests and scans with Gastroenterology but did not receive clear communication about the results, particularly regarding the need for an EGD to check for celiac disease due to elevated antibodies, and the degree of fibrosis in his liver, noted as F3 fibrosis. An MRI was suggested for further evaluation, but it was not completed.  He is concerned about the potential impact of his work environment on his health, particularly regarding alpha-1 antitrypsin deficiency, which he fears may affect his lungs due to the dusty environment he works in. He experiences a persistent cough at times, which comes and goes, and is concerned about the potential for lung damage given his occupational exposure to silica dust. No chest pain or shortness of breath.  He discusses shoulder issues, noting a history of shoulder surgery approximately 20 years ago and was advised to wait until he was at least 48 for a shoulder replacement. Recently, he has experienced increased popping and pain in his right shoulder, especially after playing tennis, which he resumed after a long hiatus.  He mentions skin concerns, specifically spots on his head that his wife wanted him to have evaluated. He wears a hat. No other skin changes beyond the mentioned spots.  In terms of lifestyle, he reports improvements in his diet, including increased consumption of fruits and vegetables and reduced intake of fried foods and  caffeine. He engages in regular physical activity, including playing tennis and walking extensively for work. He does not smoke or consume alcohol. He is currently on lisinopril  and amlodipine  for blood pressure management. No abdominal pain, urinary issues, headaches, dizziness, trouble swallowing, leg swelling, joint pains, mood problems, or sleep disturbances.      Social History   Tobacco Use  Smoking Status Never  Smokeless Tobacco Never    Current Outpatient Medications on File Prior to Visit  Medication Sig Dispense Refill   amLODipine  (NORVASC ) 10 MG tablet TAKE 1 TABLET(10 MG) BY MOUTH DAILY 90 tablet 3   ibuprofen  (ADVIL ,MOTRIN ) 200 MG tablet Take 200 mg by mouth every 6 (six) hours as needed.     No current facility-administered medications on file prior to visit.     ROS see history of present illness  Objective  Physical Exam Vitals:   02/25/24 0836  BP: 124/76  Pulse: 77  Resp: 20  Temp: 98.4 F (36.9 C)  SpO2: 98%    BP Readings from Last 3 Encounters:  02/25/24 124/76  11/25/23 (!) 147/95  07/28/23 (!) 120/92   Wt Readings from Last 3 Encounters:  02/25/24 199 lb 2 oz (90.3 kg)  11/25/23 205 lb (93 kg)  07/28/23 202 lb 9.6 oz (91.9 kg)    Physical Exam Constitutional:      General: He is not in acute distress.    Appearance: Normal appearance.  HENT:     Head: Normocephalic.     Right Ear: Tympanic membrane normal.     Left Ear: Tympanic membrane normal.     Nose: Nose normal.  Mouth/Throat:     Mouth: Mucous membranes are moist.     Pharynx: Oropharynx is clear.  Eyes:     Conjunctiva/sclera: Conjunctivae normal.     Pupils: Pupils are equal, round, and reactive to light.  Neck:     Thyroid: No thyromegaly.  Cardiovascular:     Rate and Rhythm: Normal rate and regular rhythm.     Heart sounds: Normal heart sounds.  Pulmonary:     Effort: Pulmonary effort is normal.     Breath sounds: Normal breath sounds.  Abdominal:      General: Abdomen is flat. Bowel sounds are normal.     Palpations: Abdomen is soft. There is no mass.     Tenderness: There is no abdominal tenderness.  Musculoskeletal:        General: Normal range of motion.  Lymphadenopathy:     Cervical: No cervical adenopathy.  Skin:    General: Skin is warm and dry.     Findings: No rash.  Neurological:     General: No focal deficit present.     Mental Status: He is alert.  Psychiatric:        Mood and Affect: Mood normal.        Behavior: Behavior normal.      Assessment/Plan: Please see individual problem list.  Encounter for routine adult medical exam with abnormal findings Assessment & Plan: Physical exam complete. We will order lab work as outlined and contact patient with results. He is up to date on colon cancer screening, flu shot, tetanus, and COVID vaccinations, but has deferred the shingles vaccine. His diet and exercise habits have improved. Continue routine dental and eye exams. Offer the shingles vaccine at a later date and encourage continued healthy diet and exercise habits. Return to care in 6 months, sooner as needed.    Persistent cough Assessment & Plan: Alpha-1 Antitrypsin deficiency can contribute to liver disease, lab with [facility] came back mildly decreased in March. No follow up since. No wheezing or shortness of breath. 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 Consider referral to pulmonology for further evaluation.   Orders: -     Pulmonary Visit  Chronic right shoulder pain Assessment & Plan: He experiences increased pain and popping in his right shoulder after playing tennis, with a history of shoulder surgery in 2005. He was advised to consider shoulder replacement after age 15 but is hesitant despite worsening symptoms. Refer to Dr. Kevin Spier for orthopedic evaluation and potential shoulder  replacement.  Orders: -     Ambulatory referral to Orthopedics  Skin lesion of scalp Assessment & Plan: He has irregularly shaped brown spots on his head. He uses a hat regularly but applies sunscreen inconsistently. Refer to dermatology for evaluation and a total body skin exam.   Orders: -     Ambulatory referral to Dermatology  NAFLD (nonalcoholic fatty liver disease) Assessment & Plan: He has fibrosis without cirrhosis. Treatment for fatty liver is pending further evaluation, and an MRI may be required. His celiac antibody levels are borderline abnormal, though he has no current symptoms. An EGD is recommended to confirm the diagnosis. Refer back to Dr. Therisa for follow-up and further management.  Orders: -     Hepatic function panel -     Ambulatory referral to Gastroenterology  Hyperlipidemia, unspecified hyperlipidemia type Assessment & Plan: His LDL cholesterol was 150 mg/dL in  November, with a goal to reduce it below 100 mg/dL. He previously experienced adverse effects from cholesterol medication. Check current cholesterol levels and consider starting medication if LDL remains elevated.  Orders: -     Lipid panel  Essential hypertension Assessment & Plan: His hypertension is well-controlled on Norvasc  10 mg daily and Lisinopril  40 mg daily. He reports no symptoms such as chest pain, shortness of breath, dizziness, or swelling. We will continue his current medication regimen.   Orders: -     CBC with Differential/Platelet -     Basic metabolic panel with GFR -     Lisinopril ; Take 1 tablet (40 mg total) by mouth daily.  Dispense: 90 tablet; Refill: 3  Prediabetes -     Hemoglobin A1c  Screening PSA (prostate specific antigen) -     PSA  Thyroid disorder screen -     TSH     Return in about 6 months (around 08/26/2024) for Follow up.   Leron Glance, NP-C [facility] - [facility]

## 2024-03-02 ENCOUNTER — Ambulatory Visit: Payer: Self-pay | Admitting: Nurse Practitioner

## 2024-03-13 ENCOUNTER — Encounter: Payer: Self-pay | Admitting: Nurse Practitioner

## 2024-03-13 DIAGNOSIS — Z0001 Encounter for general adult medical examination with abnormal findings: Secondary | ICD-10-CM | POA: Insufficient documentation

## 2024-03-13 DIAGNOSIS — L989 Disorder of the skin and subcutaneous tissue, unspecified: Secondary | ICD-10-CM | POA: Insufficient documentation

## 2024-03-13 DIAGNOSIS — R053 Chronic cough: Secondary | ICD-10-CM | POA: Insufficient documentation

## 2024-03-13 NOTE — Assessment & Plan Note (Signed)
 He has irregularly shaped brown spots on his head. He uses a hat regularly but applies sunscreen inconsistently. Refer to dermatology for evaluation and a total body skin exam.

## 2024-03-13 NOTE — Assessment & Plan Note (Signed)
 He experiences increased pain and popping in his right shoulder after playing tennis, with a history of shoulder surgery in 2005. He was advised to consider shoulder replacement after age 53 but is hesitant despite worsening symptoms. Refer to Dr. Kevin Spier for orthopedic evaluation and potential shoulder replacement.

## 2024-03-13 NOTE — Assessment & Plan Note (Signed)
 Alpha-1 Antitrypsin deficiency can contribute to liver disease, lab with [facility] came back mildly decreased in March. No follow up since. No wheezing or shortness of breath. 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 Consider referral to pulmonology for further evaluation.

## 2024-03-13 NOTE — Assessment & Plan Note (Signed)
 His LDL cholesterol was 150 mg/dL in November, with a goal to reduce it below 100 mg/dL. He previously experienced adverse effects from cholesterol medication. Check current cholesterol levels and consider starting medication if LDL remains elevated.

## 2024-03-13 NOTE — Assessment & Plan Note (Signed)
 Physical exam complete. We will order lab work as outlined and contact patient with results. He is up to date on colon cancer screening, flu shot, tetanus, and COVID vaccinations, but has deferred the shingles vaccine. His diet and exercise habits have improved. Continue routine dental and eye exams. Offer the shingles vaccine at a later date and encourage continued healthy diet and exercise habits. Return to care in 6 months, sooner as needed.

## 2024-03-13 NOTE — Assessment & Plan Note (Signed)
 His hypertension is well-controlled on Norvasc  10 mg daily and Lisinopril  40 mg daily. He reports no symptoms such as chest pain, shortness of breath, dizziness, or swelling. We will continue his current medication regimen.

## 2024-03-13 NOTE — Assessment & Plan Note (Addendum)
 He has fibrosis without cirrhosis. Treatment for fatty liver is pending further evaluation, and an MRI may be required. His celiac antibody levels are borderline abnormal, though he has no current symptoms. An EGD is recommended to confirm the diagnosis. Refer back to Dr. Therisa for follow-up and further management.

## 2024-03-13 NOTE — Assessment & Plan Note (Deleted)
 Physical exam complete. We will order lab work as outlined and contact patient with results. He is up to date on colon cancer screening, flu shot, tetanus, and COVID vaccinations, but has deferred the shingles vaccine. His diet and exercise habits have improved. Continue routine dental and eye exams. Offer the shingles vaccine at a later date and encourage continued healthy diet and exercise habits. Return to care in 6 months, sooner as needed.

## 2024-04-25 NOTE — Progress Notes (Signed)
 Orthopaedic New Patient Visit  Chief Complaint:   Right shoulder pain.  HPI:    This 53 y.o. male presents for a new patient evaluation with a chief complaint of right shoulder pain. He had a right shoulder arthroscopic procedure with Dr. Rexie in 2006 for chronic posterior labral tear and arthropathy.  He reports being told at that time that he would likely need a shoulder replacement in the future.  He has been living with right shoulder pain for the past 20 years.  Reports pain stays in the shoulder and does not radiate.  Denies numbness or tingling.  Reports that shoulder feels unstable and subluxes occasionally.  He has not had any full dislocation manual reduction.  Denies other injuries to shoulder.  He reports his range of motion is affected.  He has pain with movement and at rest.  He reports pain at night.  He reports feeling popping, clicking, and grinding in the shoulder.  He takes ibuprofen  as needed which provides some relief.  He has had 3 cortisone injections in the past and had bad skin reactions with all of them and not much pain relief.  He has had oral steroids in the past but not specifically for the shoulder.  He has done physical therapy after his initial surgery in 2006 but discontinued to be active and exercises shoulder on his own.  He is not diabetic.  He does not have a history of A-fib, arrhythmias, kidney disease, or stomach ulcers.  He reports has been evaluated for stenotic valve in his heart.    Past Medical History:  Diagnosis Date  . Hypertension   . Osteoarthritis     History reviewed. No pertinent surgical history.  Family History  Problem Relation Name Age of Onset  . Heart disease Father tarvis blossom   . High blood pressure (Hypertension) Father haniel fix     Current Outpatient Medications  Medication Sig Dispense Refill  . amLODIPine  (NORVASC ) 10 MG tablet Take 10 mg by mouth once daily    . ibuprofen  (ADVIL ,MOTRIN ) 200 MG tablet     .  ibuprofen  (MOTRIN ) 200 MG tablet Take 200 mg by mouth every 6 (six) hours as needed    . lisinopriL  (ZESTRIL ) 40 MG tablet Take 40 mg by mouth once daily    . *cephalexin monohydrate oral      No current facility-administered medications for this visit.    Allergies  Allergen Reactions  . Bee Venom Protein (Honey Bee) Anaphylaxis  . Bee [Allergen Ex-Venom-Wasp Protein] Anaphylaxis      Physical Exam:    General/Constitutional: No apparent distress: well-nourished and well developed. Lymphatic: No palpable adenopathy. Respiratory: Non-labored breathing Vascular: No edema, swelling or tenderness, except as noted in detailed exam. Integumentary: No impressive skin lesions present, except as noted in detailed exam. Neuro: Oriented to person, place and time. Psych: Normal mood and affect. Musculoskeletal: Normal, except as noted in detailed exam and in HPI.  Orthopaedic Specific Exam:   Focused exam of the right shoulder:  Active Elevation:170 degrees  External Rotation:25 degrees. There is a solid endpoint  Internal Rotation:Thumb reaches the buttock  Neers impingement sign: Positive  Hawkins-Kennedy impingement sign: Positive  Empty Can test:  Speeds Test: Negative  Strength:4+/5  Palpation of the shoulder reveals no tenderness at the acromioclavicular joint or biceps tendon. Abduction elicits pain. Cross-body adduction elicits mild pain. There is mild crepitus. Palpation of the C spinous processes reveals no tenderness. Neck has full ROM. The skin is  warm without ulcerations or lesions. There is no evidence of instability. The sensory exam is normal. The motor exam is normal. The vascular exam is normal.  Imaging:    I personally reviewed imaging obtained in clinic today. Xrays of the right shoulder to include AP, lateral and axillary views demonstrate severe glenohumeral osteoarthritis.    Assessment:      ICD-10-CM  1. Primary osteoarthritis of right shoulder  M19.011   2. Right shoulder pain, unspecified chronicity  M25.8    This 53 year old male presents with severe glenohumeral osteoarthritis of his right shoulder.  He would like to begin discussions about a total shoulder replacement.   Plan:    We need to get an MRI to evaluate the status of his rotator cuff to determine if he is a candidate for an anatomic total shoulder or reverse total shoulder arthroplasty.  An MRI was ordered.  He will follow-up after he receives his MRI to go over results and discuss treatment options.  All questions and concerns were answered.  He can call anytime with further concerns.  KELSEY MARIE COLLETTI, PA  cc: Gretel App, NP

## 2024-04-28 ENCOUNTER — Encounter: Payer: Self-pay | Admitting: Pulmonary Disease

## 2024-04-28 ENCOUNTER — Ambulatory Visit: Admitting: Pulmonary Disease

## 2024-04-28 ENCOUNTER — Other Ambulatory Visit
Admission: RE | Admit: 2024-04-28 | Discharge: 2024-04-28 | Disposition: A | Discharge status: Home / Self Care | Source: Ambulatory Visit | Attending: Pulmonary Disease | Admitting: Pulmonary Disease

## 2024-04-28 ENCOUNTER — Ambulatory Visit
Admission: RE | Admit: 2024-04-28 | Discharge: 2024-04-28 | Disposition: A | Discharge status: Home / Self Care | Source: Ambulatory Visit | Attending: Pulmonary Disease | Admitting: Pulmonary Disease

## 2024-04-28 VITALS — BP 138/96 | HR 65 | Temp 98.3°F | Ht 67.0 in | Wt 199.8 lb

## 2024-04-28 DIAGNOSIS — J45991 Cough variant asthma: Secondary | ICD-10-CM

## 2024-04-28 DIAGNOSIS — E8801 Alpha-1-antitrypsin deficiency: Secondary | ICD-10-CM

## 2024-04-28 DIAGNOSIS — R053 Chronic cough: Secondary | ICD-10-CM | POA: Diagnosis not present

## 2024-04-28 DIAGNOSIS — D721 Eosinophilia, unspecified: Secondary | ICD-10-CM

## 2024-04-28 LAB — PULMONARY FUNCTION TEST
DL/VA % pred: 135 %
DL/VA: 5.93 ml/min/mmHg/L
DLCO unc % pred: 142 %
DLCO unc: 37.13 ml/min/mmHg
FEF 25-75 Post: 7.02 L/s
FEF 25-75 Pre: 6.95 L/s
FEF2575-%Change-Post: 1 %
FEF2575-%Pred-Post: 234 %
FEF2575-%Pred-Pre: 231 %
FEV1-%Change-Post: 0 %
FEV1-%Pred-Post: 122 %
FEV1-%Pred-Pre: 121 %
FEV1-Post: 4.22 L
FEV1-Pre: 4.18 L
FEV1FVC-%Change-Post: 2 %
FEV1FVC-%Pred-Pre: 115 %
FEV6-%Change-Post: -1 %
FEV6-%Pred-Post: 108 %
FEV6-%Pred-Pre: 110 %
FEV6-Post: 4.65 L
FEV6-Pre: 4.7 L
FEV6FVC-%Change-Post: 0 %
FEV6FVC-%Pred-Post: 104 %
FEV6FVC-%Pred-Pre: 103 %
FVC-%Change-Post: -1 %
FVC-%Pred-Post: 104 %
FVC-%Pred-Pre: 105 %
FVC-Post: 4.65 L
FVC-Pre: 4.72 L
Post FEV1/FVC ratio: 91 %
Post FEV6/FVC ratio: 100 %
Pre FEV1/FVC ratio: 89 %
Pre FEV6/FVC Ratio: 100 %
RV % pred: 71 %
RV: 1.4 L
TLC % pred: 95 %
TLC: 6.08 L

## 2024-04-28 LAB — CBC WITH DIFFERENTIAL/PLATELET
Abs Immature Granulocytes: 0.03 K/uL (ref 0.00–0.07)
Basophils Absolute: 0.1 K/uL (ref 0.0–0.1)
Basophils Relative: 1 %
Eosinophils Absolute: 0.4 K/uL (ref 0.0–0.5)
Eosinophils Relative: 5 %
HCT: 51.7 % (ref 39.0–52.0)
Hemoglobin: 16.5 g/dL (ref 13.0–17.0)
Immature Granulocytes: 0 %
Lymphocytes Relative: 19 %
Lymphs Abs: 1.5 K/uL (ref 0.7–4.0)
MCH: 28.5 pg (ref 26.0–34.0)
MCHC: 31.9 g/dL (ref 30.0–36.0)
MCV: 89.3 fL (ref 80.0–100.0)
Monocytes Absolute: 0.7 K/uL (ref 0.1–1.0)
Monocytes Relative: 8 %
Neutro Abs: 5.6 K/uL (ref 1.7–7.7)
Neutrophils Relative %: 67 %
Platelets: 247 K/uL (ref 150–400)
RBC: 5.79 MIL/uL (ref 4.22–5.81)
RDW: 13.2 % (ref 11.5–15.5)
WBC: 8.3 K/uL (ref 4.0–10.5)
nRBC: 0 % (ref 0.0–0.2)

## 2024-04-28 LAB — NITRIC OXIDE: Nitric Oxide: 33

## 2024-04-28 MED ORDER — ARNUITY ELLIPTA 100 MCG/ACT IN AEPB
1.0000 | INHALATION_SPRAY | Freq: Every day | RESPIRATORY_TRACT | 6 refills | Status: DC
Start: 2024-04-28 — End: 2024-07-11

## 2024-04-28 NOTE — Progress Notes (Signed)
 Full PFT completed today ? ?

## 2024-04-28 NOTE — Patient Instructions (Signed)
 Full PFT completed today ? ?

## 2024-04-28 NOTE — Patient Instructions (Addendum)
 VISIT SUMMARY:  Today, you were seen for your persistent cough and low alpha-1 antitrypsin levels. We discussed your history of a dry cough, your previous diagnosis of fatty liver, and your active lifestyle. We have outlined a plan to address your symptoms and investigate further.  YOUR PLAN:  -COUGH VARIANT ASTHMA: Cough variant asthma is a type of asthma where the main symptom is a persistent cough instead of wheezing. We will conduct pulmonary function tests to assess your lung function, and you have been prescribed an inhaler to use once daily.  The name of the inhaler is Arnuity.  It is 1 puff daily.  Please remember to rinse your mouth after using the inhaler. We will also perform a blood test to check for allergies. A follow-up appointment is scheduled in 4-6 weeks.  -ALPHA-1-ANTITRYPSIN DEFICIENCY (PENDING PHENOTYPE): Alpha-1-antitrypsin deficiency is a genetic condition that can affect the lungs and liver. We need to determine your specific type (phenotype) to understand your risk and treatment options. We will conduct a blood test to identify your phenotype, a chest x-ray, and up-to-date blood work. Your use of an N95 mask in a dusty environment is good practice, and further recommendations will be made after we receive your allergen panel results.  INSTRUCTIONS:  Please complete the pulmonary function tests, blood tests for the allergy panel and alpha-1-antitrypsin phenotype, and chest x-ray as soon as possible. Schedule a follow-up appointment in 4-6 weeks to review the results and discuss the next steps.

## 2024-04-28 NOTE — Progress Notes (Signed)
 Subjective:    Patient ID: Todd Black, male    DOB: [birth year], 53 y.o.   MRN: [id number]  Patient Care Team: Gretel App, NP as PCP - General (Nurse Practitioner)  Chief Complaint  Patient presents with   Consult    Persistent dry cough x several years.     BACKGROUND: Patient is a 53 year old lifelong never smoker with chronic cough who presents for evaluation of the same.  Recently noted to have l modestly reduced levels of alpha-1 antitrypsin (no phenotype).  Has a history of nonalcoholic liver cirrhosis.  He is kindly referred by App Gretel, NP.   HPI Discussed the use of AI scribe software for clinical note transcription with the patient, who gave verbal consent to proceed.  History of Present Illness   Todd Black is a 53 year old male with low alpha 1 antitrypsin levels and chronic cough who presents for evaluation. He was referred by App Gretel, NP for the issue of low alpha 1 antitrypsin levels and chronic cough.  He has experienced a persistent dry cough for several years, which varies in intensity and worsens with pollen exposure. No sputum production is noted.  Approximately 25 to 30 years ago, he was diagnosed with a fatty liver in [place] , confirmed by liver biopsy with elevated liver enzymes and fatty infiltration. His primary care practitioner has been monitoring this condition, and during a referral to a gastroenterologist, low alpha 1 antitrypsin levels were discovered, although they were not extremely low. He also has a history of valvular heart disease.  His family history includes asthma, but no known cases of emphysema in non-smokers or other lung problems. He served in the [employer] in a [employer] unit during the first [place] War, he served in the [employer] for 3 years.  He works as an Licensed conveyancer.  He plays tennis three to four days a week for a couple of hours at a time without experiencing shortness of breath.   He does not  endorse any fevers, chills or sweats.  No weight loss or anorexia.  No jaundice, nausea or vomiting.  No abdominal pain.  No hemoptysis.  Cough has been nonproductive for the most part.  Can occur at anytime sometimes lasts for a few minutes other times paroxysms can last longer.  No significant wheezing.  DATA 11/25/2023 alpha-1 antitrypsin level: 94 mg/dL(range 898 to 812 mg/dl).  Phenotype not obtained 02/25/2024 eosinophils: 507 cells/uL     Review of Systems A 10 point review of systems was performed and it is as noted above otherwise negative.   Past Medical History:  Diagnosis Date   Allergy 1980   Arthritis    History of bloody stools    History of dermoid cyst excision    Hyperlipidemia    Hypertension     Past Surgical History:  Procedure Laterality Date   COLONOSCOPY WITH PROPOFOL  N/A 02/23/2022   Procedure: COLONOSCOPY WITH PROPOFOL ;  Surgeon: Therisa Bi, MD;  Location: [facility] ENDOSCOPY;  Service: Gastroenterology;  Laterality: N/A;   DERMOID CYST  EXCISION     LIVER BIOPSY     RADICAL ORCHIECTOMY  1999   SHOULDER ARTHROSCOPY Right 2005   Dr Franky Found in [place]    Patient Active Problem List   Diagnosis Date Noted   Skin lesion of scalp 03/13/2024   Persistent cough 03/13/2024   Encounter for routine adult medical exam with abnormal findings 03/13/2024   Chronic right shoulder pain 02/25/2024   Prediabetes  07/28/2023   Preventative health care 01/04/2023   Pain in joint of right knee 06/24/2021   Strain of hamstring muscle 06/24/2021   Pain of right calf 06/24/2021   Hypercalcemia 10/18/2018   Internal hemorrhoid 09/06/2018   LVH (left ventricular hypertrophy) 08/15/2018   NAFLD (nonalcoholic fatty liver disease) 87/94/7980   Essential hypertension 08/11/2018   Hyperlipidemia 08/11/2018    Family History  Problem Relation Age of Onset   Arthritis Father    Hyperlipidemia Father    Hypertension Father    Skin cancer Father        non-melanoma    Crohn's disease Sister    Supraventricular tachycardia Brother    Heart attack Maternal Grandfather 8    Social History   Tobacco Use   Smoking status: Never   Smokeless tobacco: Never  Substance Use Topics   Alcohol use: No    Allergies  Allergen Reactions   Bee Venom     Current Meds  Medication Sig   amLODipine  (NORVASC ) 10 MG tablet TAKE 1 TABLET(10 MG) BY MOUTH DAILY   Fluticasone Furoate  (ARNUITY ELLIPTA ) 100 MCG/ACT AEPB Inhale 1 puff into the lungs daily.   ibuprofen  (ADVIL ,MOTRIN ) 200 MG tablet Take 200 mg by mouth every 6 (six) hours as needed.   lisinopril  (ZESTRIL ) 40 MG tablet Take 1 tablet (40 mg total) by mouth daily.    Immunization History  Administered Date(s) Administered   Influenza, Seasonal, Injecte, Preservative Fre 07/28/2023   Influenza,inj,Quad PF,6+ Mos 08/11/2018, 06/08/2019, 05/28/2020, 06/23/2021, 06/05/2022   Tdap 09/06/2018        Objective:     BP (!) 138/96   Pulse 65   Temp 98.3 F (36.8 C) (Oral)   Ht 5' 7 (1.702 m)   Wt 199 lb 12.8 oz (90.6 kg)   SpO2 98%   BMI 31.29 kg/m   SpO2: 98 %  GENERAL: Well-developed, well-nourished gentleman, no acute distress. HEAD: Normocephalic, atraumatic.  EYES: Pupils equal, round, reactive to light.  No scleral icterus.  MOUTH: Poor dentition, oral mucosa moist.  No thrush. NECK: Supple. No thyromegaly. Trachea midline. No JVD.  No adenopathy. PULMONARY: Distant breath sounds bilaterally.  No adventitious sounds. CARDIOVASCULAR: S1 and S2. Regular rate and rhythm.  ABDOMEN: Benign. MUSCULOSKELETAL: No joint deformity, no clubbing, no edema.  NEUROLOGIC: No overt focal deficit, no gait disturbance, speech is fluent. SKIN: Intact,warm,dry. PSYCH: Mood and behavior normal.    Lab Results  Component Value Date   NITRICOXIDE 33 04/28/2024  *Intermediate level of type II inflammation present.       Assessment & Plan:     ICD-10-CM   1. Alpha-1-antitrypsin deficiency (HCC)   E88.01 DG Chest 2 View    Alpha-1 antitrypsin phenotype    Pulmonary function test    2. Chronic cough  R05.3 DG Chest 2 View    Allergen Panel (27) + IGE    Nitric oxide     Pulmonary function test    CBC with Differential/Platelet    3. Cough variant asthma - suspected  J45.991     4. Eosinophilia, unspecified type  D72.10       Orders Placed This Encounter  Procedures   DG Chest 2 View    Standing Status:   Future    Number of Occurrences:   1    Expiration Date:   04/28/2025    Reason for Exam (SYMPTOM  OR DIAGNOSIS REQUIRED):   Alpha 1 antitrypsin def, cough    Preferred imaging location?:  [facility]   Alpha-1 antitrypsin phenotype    Standing Status:   Future    Expiration Date:   04/28/2025   Allergen Panel (27) + IGE    Standing Status:   Future    Number of Occurrences:   1    Expiration Date:   04/28/2025   CBC with Differential/Platelet    Standing Status:   Future    Number of Occurrences:   1    Expected Date:   04/28/2024    Expiration Date:   04/28/2025   Nitric oxide    Pulmonary function test    Standing Status:   Future    Expiration Date:   04/28/2025    Where should this test be performed?:   Outpatient Pulmonary    What type of PFT is being ordered?:   Full PFT    Meds ordered this encounter  Medications   Fluticasone Furoate  (ARNUITY ELLIPTA ) 100 MCG/ACT AEPB    Sig: Inhale 1 puff into the lungs daily.    Dispense:  30 each    Refill:  6   Discussion:    Cough variant asthma Persistent cough for years, sometimes worsening, with no significant sputum production. Possible element of asthma suggested by elevated airway inflammation and high eosinophil count on recent CBCs. Cough variant asthma suspected due to cough instead of wheezing. He reports no shortness of breath and maintains an active lifestyle, playing tennis regularly. - Order pulmonary function tests (PFTs) - Prescribe inhaler (one puff daily): Arnuity Ellipta  100 - Instruct to  rinse mouth after inhaler use - Order blood test for allergy panel - Schedule follow-up in 4-6 weeks or sooner if any new issues arise  Alpha-1-antitrypsin deficiency (pending phenotype) Low alpha-1-antitrypsin levels with unknown phenotype. Potential liver involvement due to fatty liver. Need to determine phenotype to assess risk and treatment options. Supplementation for lung disease possible, but not for liver disease. Risk of worsening with supplementation if phenotype not suitable. He works in a dusty environment and uses N95 mask; further recommendations pending allergen panel results. - Order blood test for alpha-1-antitrypsin phenotype - Order chest x-ray - Order up-to-date blood work      Advised if symptoms do not improve or worsen, to please contact office for sooner follow up or seek emergency care.    I spent 60 minutes of dedicated to the care of this patient on the date of this encounter to include pre-visit review of records, face-to-face time with the patient discussing conditions above, post visit ordering of testing, clinical documentation with the electronic health record, making appropriate referrals as documented, and communicating necessary findings to members of the patients care team.   C. Leita Sanders, MD Advanced Bronchoscopy PCCM [facility]    *This note was dictated using voice recognition software/Dragon.  Despite best efforts to proofread, errors can occur which can change the meaning. Any transcriptional errors that result from this process are unintentional and may not be fully corrected at the time of dictation.

## 2024-04-30 LAB — ALLERGEN PANEL (27) + IGE
Alternaria Alternata IgE: <0.1 kU/L
Aspergillus Fumigatus IgE: <0.1 kU/L
Bahia Grass IgE: 0.12 kU/L — AB
Bermuda Grass IgE: <0.1 kU/L
Cat Dander IgE: <0.1 kU/L
Cedar, Mountain IgE: 0.12 kU/L — AB
Cladosporium Herbarum IgE: <0.1 kU/L
Cocklebur IgE: 0.44 kU/L — AB
Cockroach, American IgE: 0.55 kU/L — AB
Common Silver Birch IgE: <0.1 kU/L
D Farinae IgE: 0.95 kU/L — AB
D Pteronyssinus IgE: 1.83 kU/L — AB
Dog Dander IgE: 0.14 kU/L — AB
Elm, American IgE: <0.1 kU/L
Hickory, White IgE: <0.1 kU/L
IgE (Immunoglobulin E), Serum: 544 [IU]/mL — ABNORMAL HIGH (ref 6–495)
Johnson Grass IgE: 0.12 kU/L — AB
Kentucky Bluegrass IgE: <0.1 kU/L
Maple/Box Elder IgE: <0.1 kU/L
Mucor Racemosus IgE: <0.1 kU/L
Oak, White IgE: <0.1 kU/L
Penicillium Chrysogen IgE: <0.1 kU/L
Pigweed, Rough IgE: 0.17 kU/L — AB
Plantain, English IgE: <0.1 kU/L
Ragweed, Short IgE: <0.1 kU/L
Setomelanomma Rostrat: <0.1 kU/L
Timothy Grass IgE: 0.15 kU/L — AB
White Mulberry IgE: <0.1 kU/L

## 2024-05-01 ENCOUNTER — Other Ambulatory Visit: Payer: Self-pay

## 2024-05-01 DIAGNOSIS — M19011 Primary osteoarthritis, right shoulder: Secondary | ICD-10-CM

## 2024-05-02 ENCOUNTER — Ambulatory Visit: Payer: Self-pay | Admitting: Pulmonary Disease

## 2024-05-02 LAB — ALPHA-1-ANTITRYPSIN PHENOTYP: A-1 Antitrypsin, Ser: 91 mg/dL — ABNORMAL LOW (ref 101–187)

## 2024-05-02 NOTE — Telephone Encounter (Signed)
 Noted. Nothing further needed.

## 2024-05-03 ENCOUNTER — Ambulatory Visit
Admission: RE | Admit: 2024-05-03 | Discharge: 2024-05-03 | Disposition: A | Discharge status: Home / Self Care | Source: Ambulatory Visit

## 2024-05-03 DIAGNOSIS — M19011 Primary osteoarthritis, right shoulder: Secondary | ICD-10-CM | POA: Insufficient documentation

## 2024-05-16 DIAGNOSIS — M19011 Primary osteoarthritis, right shoulder: Secondary | ICD-10-CM | POA: Diagnosis not present

## 2024-06-01 ENCOUNTER — Encounter: Payer: Self-pay | Admitting: Pulmonary Disease

## 2024-06-01 ENCOUNTER — Ambulatory Visit: Admitting: Pulmonary Disease

## 2024-06-01 VITALS — BP 150/104 | HR 65 | Temp 97.7°F | Ht 67.0 in | Wt 201.0 lb

## 2024-06-01 DIAGNOSIS — E8801 Alpha-1-antitrypsin deficiency: Secondary | ICD-10-CM

## 2024-06-01 DIAGNOSIS — R768 Other specified abnormal immunological findings in serum: Secondary | ICD-10-CM

## 2024-06-01 DIAGNOSIS — D721 Eosinophilia, unspecified: Secondary | ICD-10-CM

## 2024-06-01 DIAGNOSIS — R053 Chronic cough: Secondary | ICD-10-CM

## 2024-06-01 DIAGNOSIS — J45991 Cough variant asthma: Secondary | ICD-10-CM

## 2024-06-01 LAB — NITRIC OXIDE: Nitric Oxide: 32

## 2024-06-01 NOTE — Progress Notes (Signed)
 Subjective:    Patient ID: Todd Black, male    DOB: September 21, 1970, 53 y.o.   MRN: [id number]  Patient Care Team: Gretel App, NP as PCP - General (Nurse Practitioner)  Chief Complaint  Patient presents with   Medical Management of Chronic Issues    Occasional cough with phlegm.    BACKGROUND/INTERVAL: Patient is a 53 year old lifelong never smoker with chronic cough who presents for follow-up of the same.  He has cough variant asthma.  Noted to have a failure and elevated IgE.  He is heterozygous alpha 1 antitrypsin phenotype MZ.  Modest decrease in alpha-1 levels.  Has a history of nonalcoholic liver cirrhosis.   HPI Discussed the use of AI scribe software for clinical note transcription with the patient, who gave verbal consent to proceed.  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  He is following up on his heterozygous alpha 1 antitrypsin deficiency. His alpha 1 antitrypsin level is currently 91, which is stable compared to previous levels. 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  He has significant allergies, with an IgE level in the 500 range. He has not yet seen an allergist for further evaluation.  He has a cough that is not severe and has recently started using an inhaler after a delay in obtaining it.  Notes that the inhaler, Arnuity, is helping reduce cough.  He is considering a shoulder replacement due to significant shoulder issues, but his orthopedic surgeon has confirmed that his bone density is excellent and not a concern for the procedure.  He has a follow-up appointment with a gastroenterologist on the 30th and is monitoring his liver function, which has improved over the past 20 years. He does not have diabetes and does not consume alcohol.       DATA 11/25/2023 alpha-1 antitrypsin level: 94 mg/dL(range 898 to 812 mg/dl).  Phenotype not obtained 02/25/2024 eosinophils: 507 cells/uL 04/28/2024 alpha 1 antitrypsin: Phenotype MZ, level 91 mg/dl 91/77/7974 allergen panel/IgE: IgE 544 IU/mL, allergen panel showed multiple sensitivities. 04/28/2024 PFTs: 4.18 L or 121% predicted, FVC 4.72 L or 105% predicted, FEV1/FVC 89%, no bronchodilator response.  Lung volumes normal.  Diffusion capacity normal to supranormal.  Normal pulmonary function.  Review of Systems A 10 point review of systems was performed and it is as noted above otherwise negative.   Patient Active Problem List   Diagnosis Date Noted   Skin lesion of scalp 03/13/2024   Persistent cough 03/13/2024   Encounter for routine adult medical exam with abnormal findings 03/13/2024   Chronic right shoulder pain 02/25/2024   Prediabetes 07/28/2023   Preventative health care 01/04/2023   Pain in joint of right knee 06/24/2021   Strain of hamstring muscle 06/24/2021   Pain of right calf 06/24/2021   Hypercalcemia 10/18/2018   Internal hemorrhoid 09/06/2018   LVH (left ventricular hypertrophy) 08/15/2018   NAFLD (nonalcoholic fatty liver disease) 87/94/7980   Essential hypertension 08/11/2018   Hyperlipidemia 08/11/2018    Social History   Tobacco Use   Smoking status: Never   Smokeless tobacco: Never  Substance Use Topics   Alcohol use: No    Allergies  Allergen Reactions   Bee Venom     Current Meds  Medication Sig   amLODipine  (NORVASC ) 10 MG tablet TAKE 1 TABLET(10 MG) BY MOUTH  DAILY   Fluticasone Furoate  (ARNUITY ELLIPTA ) 100 MCG/ACT AEPB Inhale 1 puff into the lungs daily.   ibuprofen  (ADVIL ,MOTRIN ) 200 MG tablet Take 200 mg by mouth every 6 (six) hours as needed.   lisinopril  (ZESTRIL ) 40 MG tablet Take 1 tablet (40 mg total) by mouth daily.    Immunization History  Administered Date(s) Administered   Influenza, Seasonal, Injecte, Preservative  Fre 07/28/2023   Influenza,inj,Quad PF,6+ Mos 08/11/2018, 06/08/2019, 05/28/2020, 06/23/2021, 06/05/2022   Tdap 09/06/2018        Objective:     BP (!) 150/104   Pulse 65   Temp 97.7 F (36.5 C) (Temporal)   Ht 5' 7 (1.702 m)   Wt 201 lb (91.2 kg)   SpO2 99%   BMI 31.48 kg/m   SpO2: 99 %  GENERAL: Well-developed, well-nourished gentleman, no acute distress. HEAD: Normocephalic, atraumatic.  EYES: Pupils equal, round, reactive to light.  No scleral icterus.  MOUTH: Poor dentition, oral mucosa moist.  No thrush. NECK: Supple. No thyromegaly. Trachea midline. No JVD.  No adenopathy. PULMONARY: Distant breath sounds bilaterally.  No adventitious sounds. CARDIOVASCULAR: S1 and S2. Regular rate and rhythm.  ABDOMEN: Benign. MUSCULOSKELETAL: No joint deformity, no clubbing, no edema.  NEUROLOGIC: No overt focal deficit, no gait disturbance, speech is fluent. SKIN: Intact,warm,dry. PSYCH: Mood and behavior normal.  Lab Results  Component Value Date   NITRICOXIDE 32 06/01/2024  *Intermediate level of type II inflammation present *Trend 33>>32 ppb      Assessment & Plan:     ICD-10-CM   1. Heterozygous alpha 1-antitrypsin deficiency (HCC)  E88.01     2. Cough variant asthma  J45.991 Nitric oxide     3. Eosinophilia, unspecified type  D72.10 Ambulatory referral to Allergy    4. Elevated IgE level  R76.8 Ambulatory referral to Allergy      Orders Placed This Encounter  Procedures   Ambulatory referral to Allergy    Referral Priority:   Routine    Referral Type:   Allergy Testing    Referral Reason:   Specialty Services Required    Requested Specialty:   Allergy    Number of Visits Requested:   1   Nitric oxide    Discussion:    Heterozygous alpha-1 antitrypsin deficiency (MZ genotype) Current alpha-1 antitrypsin level is 91, which is well-managed and does not necessitate supplementation. Lung function remains good. Discussed potential liver involvement, but  current liver function is well-managed. No immediate concerns related to the silica environment at work as long as protective measures are in place. - Monitor alpha-1 antitrypsin levels every six months - Advise on protective measures in silica environment - Discuss liver function with GI specialist  Chronic cough/cough variant asthma Chronic cough is present but not severe. Recently started using Arnuity inhaler after a delay in obtaining it.  Notes cough responding to the same.  No significant concerns regarding the inhaler's side effects on bone density, as confirmed by orthopedic consultation. - Continue Arnuity inhaler as prescribed  Eosinophilia Eosinophilia with elevated IgE levels in the 500 range, indicating significant allergies. No prior allergist consultation. - Refer to Dr. Maurilio, allergist and immunologist, in [place] for evaluation      Advised if symptoms do not improve or worsen, to please contact office for sooner follow up or seek emergency care.    I spent 40 minutes of dedicated to the care of this patient on the date of this encounter to include pre-visit review of records, face-to-face time with the  patient discussing conditions above, post visit ordering of testing, clinical documentation with the electronic health record, making appropriate referrals as documented, and communicating necessary findings to members of the patients care team.     C. Leita Sanders, MD Advanced Bronchoscopy PCCM [facility]    *This note was generated using voice recognition software/Dragon and/or AI transcription program.  Despite best efforts to proofread, errors can occur which can change the meaning. Any transcriptional errors that result from this process are unintentional and may not be fully corrected at the time of dictation.

## 2024-06-01 NOTE — Patient Instructions (Signed)
 VISIT SUMMARY:  Today, you had a follow-up appointment to discuss your heterozygous alpha-1 antitrypsin deficiency, chronic cough, and eosinophilia. We reviewed your current health status, including your alpha-1 antitrypsin levels, lung function, and allergies. We also discussed your work environment and upcoming medical appointments.  YOUR PLAN:  -HETEROZYGOUS ALPHA-1 ANTITRYPSIN DEFICIENCY (MZ GENOTYPE): Heterozygous alpha-1 antitrypsin deficiency is a genetic condition that can affect your lungs and liver. Your current alpha-1 antitrypsin level is stable at 91, and your lung function is good. We will continue to monitor your levels every six months. It's important to maintain protective measures in your silica work environment. Please discuss your liver function with your gastroenterologist at your upcoming appointment.  -CHRONIC COUGH: A chronic cough is a persistent cough that lasts for an extended period. Your cough is not severe, and you have recently started using an inhaler. Please continue using the inhaler as prescribed.  -EOSINOPHILIA: Eosinophilia is a condition where you have a higher than normal number of eosinophils, a type of white blood cell, often due to allergies. Your IgE levels are elevated, indicating significant allergies. We recommend that you see Dr. Tiana, an allergist and immunologist in [place], for further evaluation.  INSTRUCTIONS:  Please follow up with your gastroenterologist on the 30th to discuss your liver function. Additionally, schedule an appointment with Dr. Tiana, the allergist and immunologist in [place], for further evaluation of your allergies.

## 2024-06-21 DIAGNOSIS — M19011 Primary osteoarthritis, right shoulder: Secondary | ICD-10-CM | POA: Diagnosis not present

## 2024-06-26 ENCOUNTER — Encounter: Payer: Self-pay | Admitting: Dermatology

## 2024-06-26 ENCOUNTER — Ambulatory Visit: Admitting: Dermatology

## 2024-06-26 DIAGNOSIS — L570 Actinic keratosis: Secondary | ICD-10-CM

## 2024-06-26 DIAGNOSIS — W908XXA Exposure to other nonionizing radiation, initial encounter: Secondary | ICD-10-CM

## 2024-06-26 DIAGNOSIS — L821 Other seborrheic keratosis: Secondary | ICD-10-CM | POA: Diagnosis not present

## 2024-06-26 MED ORDER — FLUOROURACIL 5 % EX CREA: TOPICAL_CREAM | Freq: Two times a day (BID) | CUTANEOUS | 1 refills | Status: DC

## 2024-06-26 NOTE — Progress Notes (Signed)
   New Patient Visit   Subjective  Todd Black is a 53 y.o. male who presents for the following: growth  Pt has growth on his scalp that has been present around a year he'd like evaluated. Not previously treated. Spends a lot of timeout in the sun but states he wears hats.  Pt has no hx of skin cancer but possible family hx of mm  The following portions of the chart were reviewed this encounter and updated as appropriate: medications, allergies, medical history  Review of Systems:  No other skin or systemic complaints except as noted in HPI or Assessment and Plan.  Objective  Well appearing patient in no apparent distress; mood and affect are within normal limits.  A focused examination was performed of the following areas: Scalp Back Left knee  Relevant exam findings are noted in the Assessment and Plan.    Assessment & Plan   SEBORRHEIC KERATOSIS on scalp, right shoulder and behind left knee - Stuck-on, waxy, tan-brown papules and/or plaques  - Benign-appearing - Discussed benign etiology and prognosis. - Observe - Call for any changes  ACTINIC KERATOSIS Exam: Erythematous thin papules/macules with gritty scale at the scalp, nose and cheeks  Actinic keratoses are precancerous spots that appear secondary to cumulative UV radiation exposure/sun exposure over time. They are chronic with expected duration over 1 year. A portion of actinic keratoses will progress to squamous cell carcinoma of the skin. It is not possible to reliably predict which spots will progress to skin cancer and so treatment is recommended to prevent development of skin cancer.  Recommend daily broad spectrum sunscreen SPF 30+ to sun-exposed areas, reapply every 2 hours as needed.  Recommend staying in the shade or wearing long sleeves, sun glasses (UVA+UVB protection) and wide brim hats (4-inch brim around the entire circumference of the hat). Call for new or changing lesions.  Treatment  Plan: Efudex to scalp bid for 2 weeks (can also do nose and cheeks bid for 2 weeks or wait until after shoulder surgery)      Return for TBSE with brenda.  I, Darice Smock, CMA, am acting as scribe for RUFUS CHRISTELLA HOLY, MD.   Documentation: I have reviewed the above documentation for accuracy and completeness, and I agree with the above.  RUFUS CHRISTELLA HOLY, MD

## 2024-06-26 NOTE — Patient Instructions (Addendum)
Efudex Instructions 1. Apply a thin layer of Efudex cream to the specified sun  damaged area twice a day. The medication is to be applied to the entire  area, not just the keratoses (rough spots). Wash hands thoroughly after  application! 2. Use Efudex twice daily for 2 weeks to 4 weeks (depending on the  physician directions) or once a day for 4 weeks (depending on the  physician directions).  3. The skin will become red and scaly. This is the appropriate response.  4. Once redness starts, you may begin using Hydrocortisone 1% Cream (or  other topical steroid provided by the physician) twice a day. This helps  relieve pain and will speed the healing process. It may be applied as soon  as 30 minutes after Efudex application. 5. For excessive reactions or if suspicious for an infection or allergic  reaction, please call to make an appointment at my office so that I may  evaluate your response to treatment. 6. The area may be washed as you normally would with soap and water. Do  not wash the face for at least three hours after Efudex  application. 7. Ladies: Todd Black is permitted throughout the entire treatment period. Wait  30 minutes after Efudex use before applying makeup directly on top of the  medication. 8. Please read the instructional pamphlet provided by the [employer] so that you may familiarize yourself with the kinds of reactions  you may experience. 9. If you have had a biopsy on any area that you will use the medication on,  DO NOT start the Efudex until your results are given to you and  the medical assistant lets you know it is okay to start the treatment   Important Information   Due to recent changes in healthcare laws, you may see results of your pathology and/or laboratory studies on MyChart before the doctors have had a chance to review them. We understand that in some cases there may be results that are confusing or concerning to you. Please understand that  not all results are received at the same time and often the doctors may need to interpret multiple results in order to provide you with the best plan of care or course of treatment. Therefore, we ask that you please give [place] 2 business days to thoroughly review all your results before contacting the office for clarification. Should we see a critical lab result, you will be contacted sooner.     If You Need Anything After Your Visit   If you have any questions or concerns for your doctor, please call our main line at [phone]. If no one answers, please leave a voicemail as directed and we will return your call as soon as possible. Messages left after 4 pm will be answered the following business day.    You may also send [place] a message via MyChart. We typically respond to MyChart messages within 1-2 business days.  For prescription refills, please ask your pharmacy to contact our office. Our fax number is [phone].  If you have an urgent issue when the clinic is closed that cannot wait until the next business day, you can page your doctor at the number below.     Please note that while we do our best to be available for urgent issues outside of office hours, we are not available 24/7.    If you have an urgent issue and are unable to reach [place], you may choose to seek medical  care at your doctor's office, retail clinic, urgent care center, or emergency room.   If you have a medical emergency, please immediately call 911 or go to the emergency department. In the event of inclement weather, please call our main line at [phone] for an update on the status of any delays or closures.  Dermatology Medication Tips: Please keep the boxes that topical medications come in in order to help keep track of the instructions about where and how to use these. Pharmacies typically print the medication instructions only on the boxes and not directly on the medication tubes.   If your medication is too  expensive, please contact our office at [phone] or send [place] a message through MyChart.    We are unable to tell what your co-pay for medications will be in advance as this is different depending on your insurance coverage. However, we may be able to find a substitute medication at lower cost or fill out paperwork to get insurance to cover a needed medication.    If a prior authorization is required to get your medication covered by your insurance company, please allow [place] 1-2 business days to complete this process.   Drug prices often vary depending on where the prescription is filled and some pharmacies may offer cheaper prices.   The website www.goodrx.com contains coupons for medications through different pharmacies. The prices here do not account for what the cost may be with help from insurance (it may be cheaper with your insurance), but the website can give you the price if you did not use any insurance.  - You can print the associated coupon and take it with your prescription to the pharmacy.  - You may also stop by our office during regular business hours and pick up a GoodRx coupon card.  - If you need your prescription sent electronically to a different pharmacy, notify our office through [facility] or by phone at [phone]

## 2024-07-11 ENCOUNTER — Encounter: Payer: Self-pay | Admitting: Allergy and Immunology

## 2024-07-11 ENCOUNTER — Ambulatory Visit (INDEPENDENT_AMBULATORY_CARE_PROVIDER_SITE_OTHER): Admitting: Allergy and Immunology

## 2024-07-11 VITALS — BP 150/94 | HR 77 | Temp 98.3°F | Ht 71.0 in | Wt 201.0 lb

## 2024-07-11 DIAGNOSIS — Z79899 Other long term (current) drug therapy: Secondary | ICD-10-CM

## 2024-07-11 DIAGNOSIS — J4530 Mild persistent asthma, uncomplicated: Secondary | ICD-10-CM

## 2024-07-11 DIAGNOSIS — J301 Allergic rhinitis due to pollen: Secondary | ICD-10-CM | POA: Diagnosis not present

## 2024-07-11 DIAGNOSIS — J3089 Other allergic rhinitis: Secondary | ICD-10-CM

## 2024-07-11 DIAGNOSIS — J4540 Moderate persistent asthma, uncomplicated: Secondary | ICD-10-CM

## 2024-07-11 MED ORDER — AIRSUPRA 90-80 MCG/ACT IN AERO: 2.0000 | INHALATION_SPRAY | Freq: Four times a day (QID) | RESPIRATORY_TRACT | 1 refills | Status: DC | PRN

## 2024-07-11 MED ORDER — ARNUITY ELLIPTA 100 MCG/ACT IN AEPB
1.0000 | INHALATION_SPRAY | Freq: Every day | RESPIRATORY_TRACT | 6 refills | Status: AC
Start: 2024-07-11 — End: ?

## 2024-07-11 NOTE — Progress Notes (Unsigned)
 [place] - [place] - [place]   Dear Dr. Tamea,  Thank you for referring Todd Black to the [facility]  on 07/11/2024.   Below is a summation of this patient's evaluation and recommendations.  Thank you for your referral. I will keep you informed about this patient's response to treatment.   If you have any questions please do not hesitate to contact me.   Sincerely,  Todd DOROTHA Denis, MD Allergy / Immunology [facility] [place]    ______________________________________________________________________    NEW PATIENT NOTE  Referring Provider: Tamea Dedra CROME, MD Primary Provider: Gretel App, NP Date of office visit: 07/11/2024    Subjective:   Chief Complaint:  Todd Black (DOB: 1971/02/28) is a 53 y.o. male who presents to the clinic on 07/11/2024 with a chief complaint of Establish Care (Pt is here to establish care. He was referred by his Pulmonologist, Dedra Todd for Eosinophilia, unspecified type and elevated IgE level. ) .     HPI: Todd Black presents to this clinic in evaluation of asthma.  He was diagnosed with asthma in the past as well as being in MZ alpha 1 antitrypsin heterozygote.  His asthma is manifested for the most part is coughing.  He does not really have any chest tightness or shortness of breath or exercise-induced respiratory tract symptoms.  He can play tennis with no problem.  He does have throat clearing and a tickle in his throat without any raspy voice.  He has no other significant upper airway symptoms although occasionally he does have some issues with some slight sneezing and congestion during the spring and fall.  He does not have any anosmia ugly nasal discharge.  Most of his coughing is very cyclical where he can go with a very mild cough for a prolonged period in time and then develop an unrelenting cough that can go on  for weeks.  He has recently been given an Arnuity for the past 6 weeks and he thinks that maybe his cough is a little bit better.  He is on an ACE inhibitor.  Past Medical History:  Diagnosis Date   Allergy 1980   Arthritis    History of bloody stools    History of dermoid cyst excision    Hyperlipidemia    Hypertension     Past Surgical History:  Procedure Laterality Date   COLONOSCOPY WITH PROPOFOL  N/A 02/23/2022   Procedure: COLONOSCOPY WITH PROPOFOL ;  Surgeon: Therisa Bi, MD;  Location: [facility] ENDOSCOPY;  Service: Gastroenterology;  Laterality: N/A;   DERMOID CYST  EXCISION     LIVER BIOPSY     RADICAL ORCHIECTOMY  1999   SHOULDER ARTHROSCOPY Right 2005   Dr Franky Found in [place]    Allergies as of 07/11/2024       Reactions   Bee Venom         Medication List    amLODipine  10 MG tablet Commonly known as: NORVASC  TAKE 1 TABLET(10 MG) BY MOUTH DAILY   Arnuity Ellipta  100 MCG/ACT Aepb Generic drug: Fluticasone Furoate  Inhale 1 puff into the lungs daily.   fluorouracil 5 % cream Commonly known as: EFUDEX Apply topically 2 (two) times daily.   ibuprofen  200 MG tablet Commonly known as: ADVIL  Take 200 mg by mouth every 6 (six) hours as needed.   lisinopril  40 MG tablet Commonly known as: ZESTRIL  Take 1 tablet (40 mg total) by mouth daily.  Review of systems negative except as noted in HPI / PMHx or noted below:  Review of Systems  Constitutional: Negative.   HENT: Negative.    Eyes: Negative.   Respiratory: Negative.    Cardiovascular: Negative.   Gastrointestinal: Negative.   Genitourinary: Negative.   Musculoskeletal: Negative.   Skin: Negative.   Neurological: Negative.   Endo/Heme/Allergies: Negative.   Psychiatric/Behavioral: Negative.      Family History  Problem Relation Age of Onset   Arthritis Father    Hyperlipidemia Father    Hypertension Father    Skin cancer Father        non-melanoma   Crohn's disease Sister     Supraventricular tachycardia Brother    Heart attack Maternal Grandfather 80    Social History   Socioeconomic History   Marital status: Married    Spouse name: Todd Black   Number of children: Not on file   Years of education: Associates degree   Highest education level: Associate degree: academic program  Occupational History   Not on file  Tobacco Use   Smoking status: Never   Smokeless tobacco: Never  Vaping Use   Vaping status: Never Used  Substance and Sexual Activity   Alcohol use: No   Drug use: Never   Sexual activity: Yes    Birth control/protection: Other-see comments    Comment: wife with hx of cancer  Other Topics Concern   Not on file  Social History Narrative   Married to Todd Black   They have 5 children - 2 in college and 3 at home still    Enjoys: playing tennis   Exercise: Tennis when warm 2-3 times per week, winter - gets 15-20,000 steps per day   Diet: typical southern   Stress: work at a scientist, 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   Overall Financial Resource Strain (CARDIA)    Difficulty of Paying Living Expenses: Not very hard  Food Insecurity: No Food Insecurity (04/24/2024)   Received from [facility]   Hunger Vital Sign    Within the past 12 months, you worried that your food would run out before you got the money to buy more.: Never true    Within the past 12 months, the food you bought just didn't last and you didn't have money to get more.: Never true  Transportation Needs: No Transportation Needs (04/24/2024)   Received from [facility] - Transportation    In the past 12 months, has lack of transportation kept you from medical appointments or from getting medications?: No    Lack of Transportation (Non-Medical): No  Physical Activity: Sufficiently Active (02/21/2024)   Exercise  Vital Sign    Days of Exercise per Week: 7 days    Minutes of Exercise per Session: 60 min  Stress: No Stress Concern Present (02/21/2024)   [name] of Occupational Health - Occupational Stress Questionnaire    Feeling of Stress: Not at all  Social Connections: Socially Integrated (02/21/2024)   Social Connection and Isolation Panel    Frequency of Communication with Friends and Family: More than three times a week    Frequency of Social Gatherings with Friends and Family: Three times a week    Attends Religious Services: More than 4 times per year    Active Member of Clubs or Organizations: Yes  Attends Engineer, Structural: More than 4 times per year    Marital Status: Married  Catering Manager Violence: Not on Scientist, Research (life Sciences) and Social history  Lives in a house with a dry environment, a cat located inside the household, carpet in the bedroom, no plastic on the bed, no plastic on the pillow, and no smoking ongoing with inside the household.  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  Objective:   Vitals:   07/11/24 1001  BP: (!) 150/94  Pulse: 77  Temp: 98.3 F (36.8 C)  SpO2: 99%   Height: 5' 11 (180.3 cm) Weight: 201 lb (91.2 kg)  Physical Exam Constitutional:      Appearance: He is not diaphoretic.  HENT:     Head: Normocephalic.     Right Ear: Tympanic membrane, ear canal and external ear normal.     Left Ear: Tympanic membrane, ear canal and external ear normal.     Nose: Nose normal. No mucosal edema or rhinorrhea.     Mouth/Throat:     Pharynx: Uvula midline. No oropharyngeal exudate.  Eyes:     Conjunctiva/sclera: Conjunctivae normal.  Neck:     Thyroid: No thyromegaly.     Trachea: Trachea normal. No tracheal tenderness or tracheal deviation.  Cardiovascular:     Rate and Rhythm: Normal rate and regular rhythm.     Heart sounds: Normal heart sounds, S1 normal and S2 normal. No murmur heard. Pulmonary:      Effort: No respiratory distress.     Breath sounds: Normal breath sounds. No stridor. No wheezing or rales.  Lymphadenopathy:     Head:     Right side of head: No tonsillar adenopathy.     Left side of head: No tonsillar adenopathy.     Cervical: No cervical adenopathy.  Skin:    Findings: No erythema or rash.     Nails: There is no clubbing.  Neurological:     Mental Status: He is alert.     Diagnostics: Allergy skin tests were not performed.   Results of blood tests obtained 28 April 2024 identifies IgE 544 KU/L, IgE antibodies directed against dust mite, dog, trees, grasses, weeds, WBC 8.3, absolute eosinophil 400, absolute lymphocyte 1500, hemoglobin 16.5, platelet 247, alpha-1 antitrypsin phenotype MZ with a level of 91 MGs/DL,  Results of blood tests obtained 25 November 2023 identifies IgG 1217 MGs/DL, IgG a 386 mg/DL, IgM 81 MGs/DL, IgE 474 KU/L  Results of pulmonary function tests obtained 28 April 2024 identifies TLC 95% predicted, RV 71% predicted, DL/VA 864% predicted.  Results of the chest x-ray obtained 28 April 2024 identifies the following:  No focal pulmonary opacity. No pulmonary edema. No pleural effusion. No pneumothorax. No lung hyperinflation. No acute abnormality of the cardiac and mediastinal silhouettes.  Assessment and Plan:    1. Moderate persistent asthma, unspecified whether complicated     Patient Instructions   1. Allergen avoidance measures - dust mite, dog, tree, grass, weed  2. Continue Arnuity 100 - 1 inhalation 1 time per day (empty lungs)  3. Change lisinopril  to a non-ACE Inhibitor blood pressure medication (Losartan?)   4. Replace all throat clearing with drinking/swallowing maneuver  5. If needed:   A. Airsupra - 2 inhalations every 4-6 hours  6. Influenza = Tamiflu. Covid = Paxlovid  7. Return to clinic in 12 weeks or earlier if needed   Todd DOROTHA Denis, MD Allergy / Immunology [facility]  [place] 

## 2024-07-11 NOTE — Patient Instructions (Addendum)
  1. Allergen avoidance measures - dust mite, dog, tree, grass, weed  2. Continue Arnuity 100 - 1 inhalation 1 time per day (empty lungs)  3. Change lisinopril  to a non-ACE Inhibitor blood pressure medication (Losartan?)   4. Replace all throat clearing with drinking/swallowing maneuver  5. If needed:   A. Airsupra - 2 inhalations every 4-6 hours  6. Influenza = Tamiflu. Covid = Paxlovid  7. Return to clinic in 12 weeks or earlier if needed

## 2024-07-12 ENCOUNTER — Encounter: Payer: Self-pay | Admitting: Allergy and Immunology

## 2024-07-17 DIAGNOSIS — Z6831 Body mass index (BMI) 31.0-31.9, adult: Secondary | ICD-10-CM | POA: Diagnosis not present

## 2024-07-17 DIAGNOSIS — Z79899 Other long term (current) drug therapy: Secondary | ICD-10-CM | POA: Diagnosis not present

## 2024-07-17 DIAGNOSIS — E66811 Obesity, class 1: Secondary | ICD-10-CM | POA: Diagnosis not present

## 2024-07-17 DIAGNOSIS — G8918 Other acute postprocedural pain: Secondary | ICD-10-CM | POA: Diagnosis not present

## 2024-07-17 DIAGNOSIS — M25711 Osteophyte, right shoulder: Secondary | ICD-10-CM | POA: Diagnosis not present

## 2024-07-17 DIAGNOSIS — M19011 Primary osteoarthritis, right shoulder: Secondary | ICD-10-CM | POA: Diagnosis not present

## 2024-07-17 DIAGNOSIS — I10 Essential (primary) hypertension: Secondary | ICD-10-CM | POA: Diagnosis not present

## 2024-08-01 DIAGNOSIS — Z96611 Presence of right artificial shoulder joint: Secondary | ICD-10-CM | POA: Diagnosis not present

## 2024-08-08 DIAGNOSIS — R7689 Other specified abnormal immunological findings in serum: Secondary | ICD-10-CM | POA: Diagnosis not present

## 2024-08-08 DIAGNOSIS — K7581 Nonalcoholic steatohepatitis (NASH): Secondary | ICD-10-CM | POA: Diagnosis not present

## 2024-08-08 DIAGNOSIS — Z23 Encounter for immunization: Secondary | ICD-10-CM | POA: Diagnosis not present

## 2024-08-08 DIAGNOSIS — R894 Abnormal immunological findings in specimens from other organs, systems and tissues: Secondary | ICD-10-CM | POA: Diagnosis not present

## 2024-08-13 ENCOUNTER — Other Ambulatory Visit: Payer: Self-pay | Admitting: Allergy and Immunology

## 2024-08-13 DIAGNOSIS — J4530 Mild persistent asthma, uncomplicated: Secondary | ICD-10-CM

## 2024-08-15 DIAGNOSIS — Z7409 Other reduced mobility: Secondary | ICD-10-CM | POA: Diagnosis not present

## 2024-08-15 DIAGNOSIS — Z9889 Other specified postprocedural states: Secondary | ICD-10-CM | POA: Diagnosis not present

## 2024-08-15 DIAGNOSIS — M25511 Pain in right shoulder: Secondary | ICD-10-CM | POA: Diagnosis not present

## 2024-08-17 DIAGNOSIS — Z7409 Other reduced mobility: Secondary | ICD-10-CM | POA: Diagnosis not present

## 2024-08-17 DIAGNOSIS — Z9889 Other specified postprocedural states: Secondary | ICD-10-CM | POA: Diagnosis not present

## 2024-08-17 DIAGNOSIS — M25511 Pain in right shoulder: Secondary | ICD-10-CM | POA: Diagnosis not present

## 2024-08-22 DIAGNOSIS — Z9889 Other specified postprocedural states: Secondary | ICD-10-CM | POA: Diagnosis not present

## 2024-08-22 DIAGNOSIS — Z7409 Other reduced mobility: Secondary | ICD-10-CM | POA: Diagnosis not present

## 2024-08-22 DIAGNOSIS — M25511 Pain in right shoulder: Secondary | ICD-10-CM | POA: Diagnosis not present

## 2024-08-23 ENCOUNTER — Telehealth: Payer: Self-pay | Admitting: Nurse Practitioner

## 2024-08-23 NOTE — Telephone Encounter (Signed)
 Lm and MyChart message:  Todd Black will be out of the office on Friday, December 19th. Please call the office or reschedule your appointment through MyChart.  [id number] please reschedule appt.

## 2024-08-24 DIAGNOSIS — Z9889 Other specified postprocedural states: Secondary | ICD-10-CM | POA: Diagnosis not present

## 2024-08-24 DIAGNOSIS — M25511 Pain in right shoulder: Secondary | ICD-10-CM | POA: Diagnosis not present

## 2024-08-24 DIAGNOSIS — Z7409 Other reduced mobility: Secondary | ICD-10-CM | POA: Diagnosis not present

## 2024-08-24 NOTE — Telephone Encounter (Signed)
 Noted

## 2024-08-24 NOTE — Telephone Encounter (Signed)
 Copied from CRM #[id number]. Topic: Complaint (DO NOT CONVERT) - Care >> Aug 23, 2024  2:56 PM Todd Black wrote: Date of Incident: 08/23/2024 Details of complaint: Patient is upset his appointment for Friday was cancelled and next available date is not until February 2026.  How would the patient like to see it resolved? He would like an appointment as soon as possible.  On a scale of 1-10, how was your experience? 1 What would it take to bring it to a 10? Getting him an appointment as soon as possible   Route to Research Officer, Political Party.  I spoke with patient and rescheduled his appointment with Dr. Luke Shade on 08/25/2024 at 8am.

## 2024-08-25 ENCOUNTER — Ambulatory Visit

## 2024-08-25 ENCOUNTER — Ambulatory Visit: Admitting: Nurse Practitioner

## 2024-08-25 VITALS — BP 140/80 | HR 67 | Temp 98.4°F | Ht 67.0 in | Wt 205.6 lb

## 2024-08-25 DIAGNOSIS — Z96611 Presence of right artificial shoulder joint: Secondary | ICD-10-CM

## 2024-08-25 DIAGNOSIS — R7989 Other specified abnormal findings of blood chemistry: Secondary | ICD-10-CM | POA: Diagnosis not present

## 2024-08-25 DIAGNOSIS — K760 Fatty (change of) liver, not elsewhere classified: Secondary | ICD-10-CM | POA: Diagnosis not present

## 2024-08-25 DIAGNOSIS — I10 Essential (primary) hypertension: Secondary | ICD-10-CM

## 2024-08-25 DIAGNOSIS — R053 Chronic cough: Secondary | ICD-10-CM | POA: Diagnosis not present

## 2024-08-25 MED ORDER — LOSARTAN POTASSIUM 50 MG PO TABS: 50.0000 mg | ORAL_TABLET | Freq: Every day | ORAL | 0 refills | Status: DC

## 2024-08-25 NOTE — Assessment & Plan Note (Signed)
 BP goal <130/80 mmHg, not within goal.  Recent elevation likely due to increased ibuprofen  use for s/p right shoulder surgery. Lisinopril  may cause cough. Stopped lisinopril  40 mg started losartan 50 mg daily.Monitor blood pressure at home three times a week. Follow-up in 4-6 weeks to assess blood pressure and kidney function. Keep symptom journal for cough. DASH diet discussed.  Reviewed CMP and stable creatinine from 08/08/24 done at [facility].  Also counseled patient to reach out to our clinic if episodes of hypotension BP <100/60 mmHg.

## 2024-08-25 NOTE — Assessment & Plan Note (Signed)
 Discontinue Lisinopril , start Losartan 50 mg daily. Keep a symptom journal on cough and follow up with PCP in 4-6 weeks. Continue follow up with allergy specialist as well.

## 2024-08-25 NOTE — Assessment & Plan Note (Signed)
 Continue follow up with Dr. Therisa at [facility].  Lifestyle modifications including diet, exercise discussed.

## 2024-08-25 NOTE — Patient Instructions (Signed)
-   Goal BP is less than 130/80 mmHg. Check BP 3 times a week and please bring you BP reading during your follow up appointment. Stop Lisinopril  and start Losartan 50 mg daily. Keep a symptom journal to see if cough gets better with medication switch.

## 2024-08-25 NOTE — Progress Notes (Signed)
 "  Established Patient Office Visit   Subjective  Patient ID: Todd Black, male    DOB: 07-Jun-1971  Age: 53 y.o. MRN: [id number]  Chief Complaint  Patient presents with   Hypertension    Discussed the use of AI scribe software for clinical note transcription with the patient, who gave verbal consent to proceed.  History of Present Illness Todd Black is a 53 year old male who presents for hypertension management.   - He has a history of hypertension treated with Amlodipine  10 mg and Lisinopril  40 mg daily. He also has a history of Apha-1 antitrypsin phenotype MZ variant. Recently saw Dr. Maurilio ([facility]) and was suggested to discontinue ACEi and trial of ARB to see if cough would improve. Patient has a h/o hypotension on hydrochlorothiazide  (Previously tried hydrochlorothiazide  25 mg by Dr. Harlene Bring back in 08/11/2018 ).  His current home blood pressure readings are in the upper 120s to 80s.   - Asthma, Apha-1 antitrypsin phenotype MZ : Currently uses Arnuity daily for maintenance therapy. He has not needed Airsupra  yet.  He reports occasional cough but no shortness of breath or wheezing.   - Right shoulder surgery: He is five and a half weeks post-operative from a shoulder replacement surgery performed on July 17, 2024. Physical therapy started last week and is progressing. He anticipates challenges in attending sessions next week due to his wife's upcoming surgery. He has been taking ibuprofen  800 mg three times a day for swelling and pain, which he believes has contributed to elevated blood pressure.  - Since his last visit he has seen GI Dr. Therisa. (08/08/24). He has borderline positive celiac antibodies and is recommended to undergo endoscopy with biopsy. He also had mildly elevated alkaline phosphatase. He has repeat hepatic function ordered by Dr. Therisa that he plans on doing today at [facility].   - He completed a two-week course of fluorouracil  for a scalp  lesion and is scheduled for a follow-up on August 28, 2024. He did not experience the expected redness and scaling during treatment.  - He was previously active, playing tennis three to four days a week, but has gained weight due to reduced activity post-surgery.He is transitioning to a new job at [employer], which will reduce his exposure to a dusty environment, previously a concern due to silica exposure. He has been working from home recently due to his surgery and his wife's upcoming surgery.    ROS As per HPI    Objective:     BP (!) 140/80   Pulse 67   Temp 98.4 F (36.9 C) (Oral)   Ht 5' 7 (1.702 m)   Wt 205 lb 9.6 oz (93.3 kg)   SpO2 99%   BMI 32.20 kg/m      08/25/2024    8:11 AM 02/25/2024    8:39 AM 12/09/2022    3:17 PM  Depression screen PHQ 2/9  Decreased Interest 0 0 0  Down, Depressed, Hopeless 0 0 0  PHQ - 2 Score 0 0 0  Altered sleeping 0 0 0  Tired, decreased energy 0 0 0  Change in appetite 0 0 0  Feeling bad or failure about yourself  0 0 0  Trouble concentrating 0 0 0  Moving slowly or fidgety/restless 0 0 0  Suicidal thoughts 0 0 0  PHQ-9 Score 0 0  0   Difficult doing work/chores Not difficult at all Not difficult at all Not difficult at all  Data saved with a previous flowsheet row definition      08/25/2024    8:11 AM 02/25/2024    8:39 AM 12/09/2022    3:17 PM  GAD 7 : Generalized Anxiety Score  Nervous, Anxious, on Edge 0 0 0  Control/stop worrying 0 0 0  Worry too much - different things 0 0 0  Trouble relaxing 0 0 1  Restless 0 0 0  Easily annoyed or irritable 0 0 0  Afraid - awful might happen 0 0 0  Total GAD 7 Score 0 0 1  Anxiety Difficulty Not difficult at all Not difficult at all Not difficult at all      08/25/2024    8:11 AM 02/25/2024    8:39 AM 12/09/2022    3:17 PM  Depression screen PHQ 2/9  Decreased Interest 0 0 0  Down, Depressed, Hopeless 0 0 0  PHQ - 2 Score 0 0 0  Altered sleeping 0 0 0  Tired,  decreased energy 0 0 0  Change in appetite 0 0 0  Feeling bad or failure about yourself  0 0 0  Trouble concentrating 0 0 0  Moving slowly or fidgety/restless 0 0 0  Suicidal thoughts 0 0 0  PHQ-9 Score 0 0  0   Difficult doing work/chores Not difficult at all Not difficult at all Not difficult at all     Data saved with a previous flowsheet row definition      08/25/2024    8:11 AM 02/25/2024    8:39 AM 12/09/2022    3:17 PM  GAD 7 : Generalized Anxiety Score  Nervous, Anxious, on Edge 0 0 0  Control/stop worrying 0 0 0  Worry too much - different things 0 0 0  Trouble relaxing 0 0 1  Restless 0 0 0  Easily annoyed or irritable 0 0 0  Afraid - awful might happen 0 0 0  Total GAD 7 Score 0 0 1  Anxiety Difficulty Not difficult at all Not difficult at all Not difficult at all   SDOH Screenings   Food Insecurity: No Food Insecurity (08/21/2024)  Housing: Low Risk (08/21/2024)  Transportation Needs: No Transportation Needs (08/21/2024)  Utilities: Not At Risk (08/08/2024)   Received from [facility]  Depression [phone]): Low Risk (08/25/2024)  Financial Resource Strain: Low Risk (08/21/2024)  Physical Activity: Sufficiently Active (08/21/2024)  Social Connections: Socially Integrated (08/21/2024)  Stress: No Stress Concern Present (08/21/2024)  Tobacco Use: Low Risk (08/25/2024)     Physical Exam Constitutional:      Appearance: Normal appearance.  HENT:     Head: Normocephalic and atraumatic.     Mouth/Throat:     Mouth: Mucous membranes are moist.  Neck:     Thyroid: No thyroid mass or thyroid tenderness.  Cardiovascular:     Rate and Rhythm: Normal rate and regular rhythm.  Pulmonary:     Effort: Pulmonary effort is normal.     Breath sounds: Normal breath sounds.  Abdominal:     General: Bowel sounds are normal.     Palpations: Abdomen is soft.  Musculoskeletal:     Cervical back: Neck supple. No rigidity.     Right lower leg: No edema.      Left lower leg: No edema.  Skin:    General: Skin is warm.  Neurological:     Mental Status: He is alert and oriented to person, place, and time.  Psychiatric:  Mood and Affect: Mood normal.        Behavior: Behavior normal.        No results found for any visits on 08/25/24.  The 10-year ASCVD risk score (Arnett DK, et al., 2019) is: 10.1%     Assessment & Plan:   Assessment & Plan Essential hypertension BP goal <130/80 mmHg, not within goal.  Recent elevation likely due to increased ibuprofen  use for s/p right shoulder surgery. Lisinopril  may cause cough. Stopped lisinopril  40 mg started losartan 50 mg daily.Monitor blood pressure at home three times a week. Follow-up in 4-6 weeks to assess blood pressure and kidney function. Keep symptom journal for cough. DASH diet discussed.  Reviewed CMP and stable creatinine from 08/08/24 done at [facility].  Also counseled patient to reach out to our clinic if episodes of hypotension BP <100/60 mmHg.     History of total shoulder replacement, right Right shoulder arthroplasty, postoperative management 5.5 weeks post-op. Physical therapy progressing well. Swelling and pain managed with ibuprofen  800 mg, three times a day.  Continue physical therapy as scheduled.  Follow up with surgeon on January 8th, 2025 and discuss reducing Ibuprofen  dose to help with blood pressure.  Recent surgery can contribute to elevated alkaline phosphatase as well.  NAFLD (nonalcoholic fatty liver disease) Continue follow up with Dr. Therisa at [facility].  Lifestyle modifications including diet, exercise discussed.      Persistent cough Discontinue Lisinopril , start Losartan 50 mg daily. Keep a symptom journal on cough and follow up with PCP in 4-6 weeks. Continue follow up with allergy specialist as well.       Return in about 6 weeks (around 10/06/2024) for HTN and chronic follow up with PCP.   Luke Shade, MD "

## 2024-08-25 NOTE — Assessment & Plan Note (Signed)
 Right shoulder arthroplasty, postoperative management 5.5 weeks post-op. Physical therapy progressing well. Swelling and pain managed with ibuprofen  800 mg, three times a day.  Continue physical therapy as scheduled.  Follow up with surgeon on January 8th, 2025 and discuss reducing Ibuprofen  dose to help with blood pressure.  Recent surgery can contribute to elevated alkaline phosphatase as well.

## 2024-08-28 ENCOUNTER — Ambulatory Visit: Admitting: Physician Assistant

## 2024-08-28 ENCOUNTER — Encounter: Payer: Self-pay | Admitting: Physician Assistant

## 2024-08-28 DIAGNOSIS — W908XXA Exposure to other nonionizing radiation, initial encounter: Secondary | ICD-10-CM

## 2024-08-28 DIAGNOSIS — D229 Melanocytic nevi, unspecified: Secondary | ICD-10-CM

## 2024-08-28 DIAGNOSIS — D1801 Hemangioma of skin and subcutaneous tissue: Secondary | ICD-10-CM

## 2024-08-28 DIAGNOSIS — L821 Other seborrheic keratosis: Secondary | ICD-10-CM | POA: Diagnosis not present

## 2024-08-28 DIAGNOSIS — Z1283 Encounter for screening for malignant neoplasm of skin: Secondary | ICD-10-CM | POA: Diagnosis not present

## 2024-08-28 DIAGNOSIS — Z872 Personal history of diseases of the skin and subcutaneous tissue: Secondary | ICD-10-CM

## 2024-08-28 DIAGNOSIS — L578 Other skin changes due to chronic exposure to nonionizing radiation: Secondary | ICD-10-CM | POA: Diagnosis not present

## 2024-08-28 DIAGNOSIS — L814 Other melanin hyperpigmentation: Secondary | ICD-10-CM

## 2024-08-28 NOTE — Patient Instructions (Signed)

## 2024-08-28 NOTE — Progress Notes (Signed)
" ° °  Total Body Skin Exam (TBSE) Visit   Subjective  Todd Black is a 53 y.o. male ESTABLISHED PATIENT who presents for the following:  Total Body Skin Exam (TBSE)  Patient was last evaluated on 06/26/24 for AK's with Dr. Corey. Pt used 5FU BID for 2 weeks for scalp AK's.   The following portions of the chart were reviewed this encounter and updated as appropriate: medications, allergies, medical history  Review of Systems:  No other skin or systemic complaints except as noted in HPI or Assessment and Plan.  Objective  Well appearing patient in no apparent distress; mood and affect are within normal limits.  A full examination was performed including scalp, head, eyes, ears, nose, lips, neck, chest, axillae, abdomen, back, buttocks, bilateral upper extremities, bilateral lower extremities, hands, feet, fingers, toes, fingernails, and toenails. All findings within normal limits unless otherwise noted below.   Relevant physical exam findings are noted in the Assessment and Plan.   Assessment & Plan   HISTORY OF PRECANCEROUS ACTINIC KERATOSIS - site(s) of PreCancerous Actinic Keratosis clear today. - these may recur and new lesions may form requiring treatment to prevent transformation into skin cancer - observe for new or changing spots and contact [facility] for appointment if occur - photoprotection with sun protective clothing; sunglasses and broad spectrum sunscreen with SPF of at least 30 + and frequent self skin exams recommended - yearly exams by a dermatologist recommended for persons with history of PreCancerous Actinic Keratoses  LENTIGINES, SEBORRHEIC KERATOSES, HEMANGIOMAS - Benign normal skin lesions - Benign-appearing - Call for any changes  BENIGN MELANOCYTIC NEVI - Tan-brown and/or pink-flesh-colored symmetric macules and papules - Benign appearing on exam today - Observation - Call clinic for new or changing moles - Recommend daily use of broad  spectrum spf 30+ sunscreen to sun-exposed areas.   MILD ACTINIC DAMAGE - Chronic condition, secondary to cumulative UV/sun exposure - diffuse scaly erythematous macules with underlying dyspigmentation - Recommend daily broad spectrum sunscreen SPF 30+ to sun-exposed areas, reapply every 2 hours as needed.  - Staying in the shade or wearing long sleeves, sun glasses (UVA+UVB protection) and wide brim hats (4-inch brim around the entire circumference of the hat) are also recommended for sun protection.  - Call for new or changing lesions.   SKIN CANCER SCREENING PERFORMED TODAY. SCREENING EXAM FOR SKIN CANCER   ACTINIC SKIN DAMAGE   CHERRY ANGIOMA   LENTIGINES   MULTIPLE BENIGN NEVI   SEBORRHEIC KERATOSIS   HISTORY OF ACTINIC KERATOSES   Return in about 1 year (around 08/28/2025) for TBSE.   Documentation: I have reviewed the above documentation for accuracy and completeness, and I agree with the above.  I, Shirron Maranda, CMA II, am acting as scribe for:  Raziel Koenigs K, PA-C "

## 2024-08-29 NOTE — Addendum Note (Signed)
 Addended by: ORMAN AMERICA on: 08/29/2024 12:57 PM   Modules accepted: Level of Service

## 2024-09-01 DIAGNOSIS — Z7409 Other reduced mobility: Secondary | ICD-10-CM | POA: Diagnosis not present

## 2024-09-01 DIAGNOSIS — M25511 Pain in right shoulder: Secondary | ICD-10-CM | POA: Diagnosis not present

## 2024-09-01 DIAGNOSIS — Z9889 Other specified postprocedural states: Secondary | ICD-10-CM | POA: Diagnosis not present

## 2024-09-05 DIAGNOSIS — Z7409 Other reduced mobility: Secondary | ICD-10-CM | POA: Diagnosis not present

## 2024-09-05 DIAGNOSIS — Z9889 Other specified postprocedural states: Secondary | ICD-10-CM | POA: Diagnosis not present

## 2024-09-05 DIAGNOSIS — M25511 Pain in right shoulder: Secondary | ICD-10-CM | POA: Diagnosis not present

## 2024-09-06 DIAGNOSIS — M25511 Pain in right shoulder: Secondary | ICD-10-CM | POA: Diagnosis not present

## 2024-09-06 DIAGNOSIS — Z9889 Other specified postprocedural states: Secondary | ICD-10-CM | POA: Diagnosis not present

## 2024-09-06 DIAGNOSIS — Z7409 Other reduced mobility: Secondary | ICD-10-CM | POA: Diagnosis not present

## 2024-09-27 ENCOUNTER — Ambulatory Visit: Admitting: Dermatology

## 2024-09-29 ENCOUNTER — Encounter: Payer: Self-pay | Admitting: Pulmonary Disease

## 2024-09-29 ENCOUNTER — Ambulatory Visit: Admitting: Pulmonary Disease

## 2024-09-29 VITALS — BP 142/96 | HR 75 | Temp 97.7°F | Ht 67.0 in | Wt 209.0 lb

## 2024-09-29 DIAGNOSIS — E8801 Alpha-1-antitrypsin deficiency: Secondary | ICD-10-CM

## 2024-09-29 DIAGNOSIS — J45991 Cough variant asthma: Secondary | ICD-10-CM

## 2024-09-29 NOTE — Patient Instructions (Signed)
 VISIT SUMMARY:  Todd Black, you had a follow-up appointment today to review your cough variant asthma and heterozygous alpha-1 antitrypsin deficiency. Your asthma is well-managed, and you have not experienced any recent exacerbations. You also expressed concerns about the genetic transmission of alpha-1 antitrypsin deficiency to your children.  YOUR PLAN:  -COUGH VARIANT ASTHMA: Cough variant asthma is a type of asthma where the main symptom is a dry, non-productive cough. Your condition is currently well-managed with Arnuity, and you have not needed to use your rescue inhaler, Airsupra . Continue using Arnuity as prescribed and use Airsupra  if you experience acute symptoms. We will schedule a follow-up in six months unless your symptoms worsen.  -ALPHA-1 ANTITRYPSIN DEFICIENCY (HETEROZYGOUS): Alpha-1 antitrypsin deficiency is a genetic condition that can affect lung and liver function. You are concerned about the risk to your children. It is recommended that your children be tested for this condition. The younger children should be tested by their pediatrician, and the older ones can be screened by your family doctor using a cheek swab. Free testing is available at the [facility] clinic for those still on your insurance.  INSTRUCTIONS:  Please ensure your children are tested for alpha-1 antitrypsin deficiency. Coordinate with your primary care provider for testing orders and guidance. We will follow up in six months for your asthma unless your symptoms worsen.

## 2024-09-29 NOTE — Progress Notes (Signed)
 "  Subjective:    Patient ID: Todd Black, male    DOB: [birth year], 54 y.o.   MRN: [id number]  Patient Care Team: Gretel App, NP as PCP - General (Nurse Practitioner)  Chief Complaint  Patient presents with   Asthma    No breathing problems.     BACKGROUND/INTERVAL:Patient is a 54 year old lifelong never smoker with chronic cough who presents for follow-up of the same.  He has cough variant asthma.  Noted to have eosinophilia and elevated IgE.  He is heterozygous alpha 1 antitrypsin phenotype MZ.  Modest decrease in alpha-1 levels.  Has a history of nonalcoholic liver cirrhosis.  Patient was last seen 01 June 2024.  No new issues since that  HPI Discussed the use of AI scribe software for clinical note transcription with the patient, who gave verbal consent to proceed.  History of Present Illness   Todd Black is a 54 year old male with cough variant asthma and heterozygous alpha-1 antitrypsin deficiency who presents for follow-up.  His cough variant asthma is currently well-managed with no recent exacerbations or issues with shortness of breath. He has not needed to use his rescue inhaler, Airsupra , and continues to use Arnuity as prescribed. He recently changed jobs and is no longer exposed to dust in his work environment.  He recently underwent right shoulder surgery, which has limited his physical activity.  Regarding his heterozygous alpha-1 antitrypsin deficiency, he is concerned about the potential genetic transmission to his children. He has five children, aged 29 to 73.  We discussed his children should be screened for alpha 1.  Also discussed how to go about this.     DATA 11/25/2023 alpha-1 antitrypsin level: 94 mg/dL(range 898 to 812 mg/dl).  Phenotype not obtained 02/25/2024 eosinophils: 507 cells/uL 04/28/2024 alpha 1 antitrypsin: Phenotype MZ, level 91 mg/dl 91/77/7974 allergen panel/IgE: IgE 544 IU/mL, allergen panel showed multiple  sensitivities. 04/28/2024 PFTs: 4.18 L or 121% predicted, FVC 4.72 L or 105% predicted, FEV1/FVC 89%, no bronchodilator response.  Lung volumes normal.  Diffusion capacity normal to supranormal.  Normal pulmonary function.  Review of Systems A 10 point review of systems was performed and it is as noted above otherwise negative.   Patient Active Problem List   Diagnosis Date Noted   History of total shoulder replacement, right 08/01/2024   Skin lesion of scalp 03/13/2024   Persistent cough 03/13/2024   Encounter for routine adult medical exam with abnormal findings 03/13/2024   Prediabetes 07/28/2023   Preventative health care 01/04/2023   Pain in joint of right knee 06/24/2021   Strain of hamstring muscle 06/24/2021   Pain of right calf 06/24/2021   Internal hemorrhoid 09/06/2018   LVH (left ventricular hypertrophy) 08/15/2018   NAFLD (nonalcoholic fatty liver disease) 87/94/7980   Essential hypertension 08/11/2018   Hyperlipidemia 08/11/2018    Social History   Tobacco Use   Smoking status: Never   Smokeless tobacco: Never  Substance Use Topics   Alcohol use: No    Allergies[1]  Active Medications[2]  Immunization History  Administered Date(s) Administered   Influenza, Seasonal, Injecte, Preservative Fre 07/28/2023   Influenza,inj,Quad PF,6+ Mos 08/11/2018, 06/08/2019, 05/28/2020, 06/23/2021, 06/05/2022   Moderna Covid-19 Vaccine Bivalent Booster 19yrs & up 07/12/2021   PFIZER(Purple Top)SARS-COV-2 Vaccination 11/13/2019, 12/11/2019, 08/27/2020   Tdap 09/06/2018        Objective:     Vitals:   09/29/24 0834  BP: (!) 142/96  Pulse: 75  Temp: 97.7 F (36.5 C)  Height: 5' 7 (  1.702 m)  Weight: 209 lb (94.8 kg)  SpO2: 99%  TempSrc: Temporal  BMI (Calculated): 32.73     SpO2: 99 %  GENERAL: Well-developed, well-nourished gentleman, no acute distress. HEAD: Normocephalic, atraumatic.  EYES: Pupils equal, round, reactive to light.  No scleral icterus.   MOUTH: Poor dentition, oral mucosa moist.  No thrush. NECK: Supple. No thyromegaly. Trachea midline. No JVD.  No adenopathy. PULMONARY: Distant breath sounds bilaterally.  No adventitious sounds. CARDIOVASCULAR: S1 and S2. Regular rate and rhythm.  ABDOMEN: Benign. MUSCULOSKELETAL: No joint deformity, no clubbing, no edema.  NEUROLOGIC: No overt focal deficit, no gait disturbance, speech is fluent. SKIN: Intact,warm,dry. PSYCH: Mood and behavior normal.       Assessment & Plan:     ICD-10-CM   1. Cough variant asthma  J45.991     2. Heterozygous alpha 1-antitrypsin deficiency (HCC)  E88.01      Discussion:    Cough variant asthma Well-managed with no recent exacerbations or dyspnea. He is on Arnuity, a low-toxicity medication, and has not needed Airsupra , a rescue inhaler. He has a sufficient supply of Arnuity due to an auto-refill system and is not concerned about potential insurance changes affecting medication access. - Continue Arnuity as prescribed. - Use Airsupra  as needed for acute symptoms. - Scheduled follow-up in six months unless symptoms worsen.  Alpha-1 antitrypsin deficiency (heterozygous) Heterozygous alpha-1 antitrypsin deficiency is present. He is concerned about his children's risk and advised that his children should be tested for the condition. The pediatrician should check the younger children, and the family doctor can screen for the phenotype using a cheek swab. He has access to free testing at the [facility] clinic for his children who are still on his insurance. - Ensure children are tested for alpha-1 antitrypsin deficiency, focusing on phenotype testing. - Coordinate with primary care provider for testing orders and guidance.       Advised if symptoms do not improve or worsen, to please contact office for sooner follow up or seek emergency care.    I spent 22 minutes of dedicated to the care of this patient on the date of this encounter to include  pre-visit review of records, face-to-face time with the patient discussing conditions above, post visit ordering of testing, clinical documentation with the electronic health record, making appropriate referrals as documented, and communicating necessary findings to members of the patients care team.     C. Leita Sanders, MD [facility]    *This note was generated using voice recognition software/Dragon and/or AI transcription program.  Despite best efforts to proofread, errors can occur which can change the meaning. Any transcriptional errors that result from this process are unintentional and may not be fully corrected at the time of dictation.    [1]  Allergies Allergen Reactions   Bee Venom    Hydrochlorothiazide      Previously tried hydrochlorothiazide  25 mg by Dr. Harlene Bring back in 08/11/2018, caused hypotension  [2]  Current Meds  Medication Sig   AIRSUPRA  90-80 MCG/ACT AERO INHALE 2 PUFFS INTO THE LUNGS EVERY 6 HOURS AS NEEDED   amLODipine  (NORVASC ) 10 MG tablet TAKE 1 TABLET(10 MG) BY MOUTH DAILY   Fluticasone Furoate  (ARNUITY ELLIPTA ) 100 MCG/ACT AEPB Inhale 1 puff into the lungs daily.   ibuprofen  (ADVIL ) 800 MG tablet Take 800 mg by mouth 3 (three) times daily.   losartan  (COZAAR ) 50 MG tablet Take 1 tablet (50 mg total) by mouth daily.   Vitamin D,  Ergocalciferol, (DRISDOL) 1.25 MG (50000 UNIT) CAPS capsule Take 50,000 Units by mouth every 7 (seven) days.   "

## 2024-10-03 ENCOUNTER — Ambulatory Visit: Admitting: Allergy and Immunology

## 2024-10-10 ENCOUNTER — Ambulatory Visit: Admitting: Dermatology

## 2024-10-12 ENCOUNTER — Encounter: Payer: Self-pay | Admitting: Nurse Practitioner

## 2024-10-12 ENCOUNTER — Ambulatory Visit: Admitting: Nurse Practitioner

## 2024-10-12 VITALS — BP 144/88 | HR 97 | Temp 98.2°F | Ht 67.0 in | Wt 213.0 lb

## 2024-10-12 DIAGNOSIS — I10 Essential (primary) hypertension: Secondary | ICD-10-CM

## 2024-10-12 DIAGNOSIS — J45991 Cough variant asthma: Secondary | ICD-10-CM | POA: Insufficient documentation

## 2024-10-12 DIAGNOSIS — Z96611 Presence of right artificial shoulder joint: Secondary | ICD-10-CM

## 2024-10-12 DIAGNOSIS — E8801 Alpha-1-antitrypsin deficiency: Secondary | ICD-10-CM | POA: Insufficient documentation

## 2024-10-12 MED ORDER — LOSARTAN POTASSIUM 100 MG PO TABS: 100.0000 mg | ORAL_TABLET | Freq: Every day | ORAL | 1 refills | Status: AC

## 2024-10-12 NOTE — Assessment & Plan Note (Signed)
 Three months post-surgery, he is undergoing physical therapy and has been released from most restrictions. Anticipated inflammation with increased activity. Continue ibuprofen  as needed. Continue physical therapy focusing on strengthening. Resume exercise activities as tolerated, including running and biking. Follow up with Ortho as scheduled.

## 2024-10-12 NOTE — Progress Notes (Signed)
 " Todd Glance, NP-C Phone: [phone]  Discussed the use of AI scribe software for clinical note transcription with the patient, who gave verbal consent to proceed.  History of Present Illness   Todd Black is a 54 year old male with hypertension and asthma who presents for follow-up of blood pressure management and post-operative care for right shoulder replacement.  He is currently taking losartan  50 mg and amlodipine  for hypertension. His blood pressure readings have been in the upper 130s to occasionally 140s systolic, with diastolic readings between 87 to 93. He has no chest pain, shortness of breath, dizziness, or swelling.  He has a history of asthma and alpha-1 antitrypsin deficiency, managed by pulmonology. He is currently using Arnuity and has not needed Airsupra . His cough has improved since switching from lisinopril  to losartan .  Three months ago, he underwent right shoulder replacement surgery. Initially, he was on ibuprofen  800 mg three times a day, now reduced to once a day. He started physical therapy after the first month post-surgery, focusing on range of motion exercises with restrictions on lifting. Recently, these restrictions have been lifted, allowing him to start strengthening exercises. He has gained 10 pounds post-surgery due to reduced physical activity but is now able to resume running and biking.  Socially, he has changed jobs and is no longer in a dusty environment, which has positively impacted his respiratory symptoms. 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      Tobacco Use History[1]  Medications Ordered Prior to Encounter[2]   ROS see history of present illness  Objective  Physical Exam Vitals:   10/12/24 1432 10/12/24 1457  BP: (!) 150/90 (!) 144/88  Pulse: 97   Temp: 98.2 F  (36.8 C)   SpO2: 98%     BP Readings from Last 3 Encounters:  10/12/24 (!) 144/88  09/29/24 (!) 142/96  08/25/24 (!) 140/80   Wt Readings from Last 3 Encounters:  10/12/24 213 lb (96.6 kg)  09/29/24 209 lb (94.8 kg)  08/25/24 205 lb 9.6 oz (93.3 kg)    Physical Exam Constitutional:      General: He is not in acute distress.    Appearance: Normal appearance.  HENT:     Head: Normocephalic.  Cardiovascular:     Rate and Rhythm: Normal rate and regular rhythm.     Heart sounds: Normal heart sounds.  Pulmonary:     Effort: Pulmonary effort is normal.     Breath sounds: Normal breath sounds.  Skin:    General: Skin is warm and dry.  Neurological:     General: No focal deficit present.     Mental Status: He is alert.  Psychiatric:        Mood and Affect: Mood normal.        Behavior: Behavior normal.      Assessment/Plan: Please see individual problem list.  Essential hypertension Assessment & Plan: Blood pressure remains uncontrolled with current regimen, averaging in the upper 130s/87-93 mmHg, occasionally reaching 140 mmHg systolic. Increased losartan  to 100 mg daily, taken as two 50 mg tablets. Sent new prescription for losartan  100 mg. Continue amlodipine  10 mg daily. Monitor blood pressure regularly. Follow-up scheduled in six weeks. Check BMP.   Orders: -     Basic metabolic panel with GFR; Future -  Losartan  Potassium; Take 1 tablet (100 mg total) by mouth daily.  Dispense: 90 tablet; Refill: 1  History of total shoulder replacement, right Assessment & Plan: Three months post-surgery, he is undergoing physical therapy and has been released from most restrictions. Anticipated inflammation with increased activity. Continue ibuprofen  as needed. Continue physical therapy focusing on strengthening. Resume exercise activities as tolerated, including running and biking. Follow up with Ortho as scheduled.    Cough variant asthma Assessment & Plan: Well-managed  with improved cough after switching to losartan . Continues on Arnuity. He has Airsupra  but has never had to use it. Continue current asthma management. Follow up with Pulmonology as scheduled.    Heterozygous alpha 1-antitrypsin deficiency (HCC)     Return in about 4 weeks (around 11/09/2024) for Follow up.   Todd Glance, NP-C [facility] [place]    [1]  Social History Tobacco Use  Smoking Status Never  Smokeless Tobacco Never  [2]  Current Outpatient Medications on File Prior to Visit  Medication Sig Dispense Refill   AIRSUPRA  90-80 MCG/ACT AERO INHALE 2 PUFFS INTO THE LUNGS EVERY 6 HOURS AS NEEDED 10.7 g 1   amLODipine  (NORVASC ) 10 MG tablet TAKE 1 TABLET(10 MG) BY MOUTH DAILY 90 tablet 3   Fluticasone Furoate  (ARNUITY ELLIPTA ) 100 MCG/ACT AEPB Inhale 1 puff into the lungs daily. 30 each 6   ibuprofen  (ADVIL ) 800 MG tablet Take 800 mg by mouth 3 (three) times daily.     Vitamin D, Ergocalciferol, (DRISDOL) 1.25 MG (50000 UNIT) CAPS capsule Take 50,000 Units by mouth every 7 (seven) days.     No current facility-administered medications on file prior to visit.   "

## 2024-10-12 NOTE — Assessment & Plan Note (Signed)
 Well-managed with improved cough after switching to losartan . Continues on Arnuity. He has Airsupra  but has never had to use it. Continue current asthma management. Follow up with Pulmonology as scheduled.

## 2024-10-12 NOTE — Assessment & Plan Note (Signed)
 Blood pressure remains uncontrolled with current regimen, averaging in the upper 130s/87-93 mmHg, occasionally reaching 140 mmHg systolic. Increased losartan  to 100 mg daily, taken as two 50 mg tablets. Sent new prescription for losartan  100 mg. Continue amlodipine  10 mg daily. Monitor blood pressure regularly. Follow-up scheduled in six weeks. Check BMP.

## 2024-11-24 ENCOUNTER — Ambulatory Visit: Admitting: Nurse Practitioner

## 2025-09-03 ENCOUNTER — Ambulatory Visit: Admitting: Physician Assistant
